# Patient Record
Sex: Female | Born: 1955
Health system: Southern US, Community
[De-identification: ages and names within clinical notes are randomized; demographics above are authoritative.]

## PROBLEM LIST (undated history)

## (undated) DIAGNOSIS — N393 Stress incontinence (female) (male): Secondary | ICD-10-CM

## (undated) DIAGNOSIS — F329 Major depressive disorder, single episode, unspecified: Secondary | ICD-10-CM

## (undated) DIAGNOSIS — M722 Plantar fascial fibromatosis: Secondary | ICD-10-CM

## (undated) DIAGNOSIS — F32A Depression, unspecified: Secondary | ICD-10-CM

---

## 2002-11-05 ENCOUNTER — Ambulatory Visit (HOSPITAL_COMMUNITY): Admission: RE | Admit: 2002-11-05 | Discharge: 2002-11-05 | Payer: Self-pay | Admitting: Gastroenterology

## 2003-03-03 ENCOUNTER — Other Ambulatory Visit: Admission: RE | Admit: 2003-03-03 | Discharge: 2003-03-03 | Payer: Self-pay | Admitting: Family Medicine

## 2004-10-25 ENCOUNTER — Ambulatory Visit (HOSPITAL_COMMUNITY): Admission: RE | Admit: 2004-10-25 | Discharge: 2004-10-25 | Payer: Self-pay | Admitting: Family Medicine

## 2004-10-25 ENCOUNTER — Ambulatory Visit: Admission: RE | Admit: 2004-10-25 | Discharge: 2004-10-25 | Payer: Self-pay | Admitting: Family Medicine

## 2004-11-09 ENCOUNTER — Other Ambulatory Visit: Admission: RE | Admit: 2004-11-09 | Discharge: 2004-11-09 | Payer: Self-pay | Admitting: Family Medicine

## 2005-02-02 HISTORY — PX: LAPAROSCOPIC GASTRIC BYPASS: SUR771

## 2006-01-25 ENCOUNTER — Other Ambulatory Visit: Admission: RE | Admit: 2006-01-25 | Discharge: 2006-01-25 | Payer: Self-pay | Admitting: Family Medicine

## 2006-03-28 HISTORY — PX: PANNICULECTOMY: SHX5360

## 2006-03-28 HISTORY — PX: PLACEMENT OF BREAST IMPLANTS: SHX6334

## 2007-03-07 ENCOUNTER — Other Ambulatory Visit: Admission: RE | Admit: 2007-03-07 | Discharge: 2007-03-07 | Payer: Self-pay | Admitting: Family Medicine

## 2008-03-14 ENCOUNTER — Other Ambulatory Visit: Admission: RE | Admit: 2008-03-14 | Discharge: 2008-03-14 | Payer: Self-pay | Admitting: Family Medicine

## 2008-05-29 ENCOUNTER — Other Ambulatory Visit: Admission: RE | Admit: 2008-05-29 | Discharge: 2008-05-29 | Payer: Self-pay | Admitting: Family Medicine

## 2009-03-31 ENCOUNTER — Other Ambulatory Visit: Admission: RE | Admit: 2009-03-31 | Discharge: 2009-03-31 | Payer: Self-pay | Admitting: Family Medicine

## 2010-10-05 ENCOUNTER — Other Ambulatory Visit: Payer: Self-pay | Admitting: Family Medicine

## 2010-10-05 ENCOUNTER — Other Ambulatory Visit (HOSPITAL_COMMUNITY)
Admission: RE | Admit: 2010-10-05 | Discharge: 2010-10-05 | Disposition: A | Discharge status: Home / Self Care | Payer: BC Managed Care – PPO | Source: Ambulatory Visit | Attending: Family Medicine | Admitting: Family Medicine

## 2010-10-05 DIAGNOSIS — Z0000 Encounter for general adult medical examination without abnormal findings: Secondary | ICD-10-CM | POA: Insufficient documentation

## 2010-11-19 ENCOUNTER — Other Ambulatory Visit: Payer: Self-pay | Admitting: Dermatology

## 2011-04-18 ENCOUNTER — Other Ambulatory Visit (HOSPITAL_COMMUNITY)
Admission: RE | Admit: 2011-04-18 | Discharge: 2011-04-18 | Disposition: A | Discharge status: Home / Self Care | Payer: BC Managed Care – PPO | Source: Ambulatory Visit | Attending: Family Medicine | Admitting: Family Medicine

## 2011-04-18 ENCOUNTER — Other Ambulatory Visit: Payer: Self-pay | Admitting: Family Medicine

## 2011-04-18 DIAGNOSIS — R87619 Unspecified abnormal cytological findings in specimens from cervix uteri: Secondary | ICD-10-CM | POA: Insufficient documentation

## 2011-10-07 ENCOUNTER — Other Ambulatory Visit: Payer: Self-pay | Admitting: Family Medicine

## 2011-10-07 ENCOUNTER — Other Ambulatory Visit (HOSPITAL_COMMUNITY)
Admission: RE | Admit: 2011-10-07 | Discharge: 2011-10-07 | Disposition: A | Discharge status: Home / Self Care | Payer: BC Managed Care – PPO | Source: Ambulatory Visit | Attending: Family Medicine | Admitting: Family Medicine

## 2011-10-07 DIAGNOSIS — R87619 Unspecified abnormal cytological findings in specimens from cervix uteri: Secondary | ICD-10-CM | POA: Insufficient documentation

## 2012-04-18 ENCOUNTER — Other Ambulatory Visit (HOSPITAL_COMMUNITY)
Admission: RE | Admit: 2012-04-18 | Discharge: 2012-04-18 | Disposition: A | Discharge status: Home / Self Care | Payer: BC Managed Care – PPO | Source: Ambulatory Visit | Attending: Family Medicine | Admitting: Family Medicine

## 2012-04-18 ENCOUNTER — Other Ambulatory Visit: Payer: Self-pay | Admitting: Family Medicine

## 2012-04-18 DIAGNOSIS — R87619 Unspecified abnormal cytological findings in specimens from cervix uteri: Secondary | ICD-10-CM | POA: Insufficient documentation

## 2013-08-15 ENCOUNTER — Other Ambulatory Visit (HOSPITAL_COMMUNITY)
Admission: RE | Admit: 2013-08-15 | Discharge: 2013-08-15 | Disposition: A | Discharge status: Home / Self Care | Payer: BC Managed Care – PPO | Source: Ambulatory Visit | Attending: Family Medicine | Admitting: Family Medicine

## 2013-08-15 ENCOUNTER — Other Ambulatory Visit: Payer: Self-pay | Admitting: Family Medicine

## 2013-08-15 DIAGNOSIS — Z0000 Encounter for general adult medical examination without abnormal findings: Secondary | ICD-10-CM | POA: Insufficient documentation

## 2013-08-15 DIAGNOSIS — Z1151 Encounter for screening for human papillomavirus (HPV): Secondary | ICD-10-CM | POA: Insufficient documentation

## 2013-12-18 ENCOUNTER — Other Ambulatory Visit: Payer: Self-pay | Admitting: Dermatology

## 2014-03-18 ENCOUNTER — Other Ambulatory Visit: Payer: Self-pay | Admitting: Dermatology

## 2015-06-10 ENCOUNTER — Other Ambulatory Visit: Payer: Self-pay | Admitting: Family Medicine

## 2015-06-10 ENCOUNTER — Other Ambulatory Visit (HOSPITAL_COMMUNITY)
Admission: RE | Admit: 2015-06-10 | Discharge: 2015-06-10 | Disposition: A | Discharge status: Home / Self Care | Payer: BLUE CROSS/BLUE SHIELD | Source: Ambulatory Visit | Attending: Family Medicine | Admitting: Family Medicine

## 2015-06-10 DIAGNOSIS — Z124 Encounter for screening for malignant neoplasm of cervix: Secondary | ICD-10-CM | POA: Insufficient documentation

## 2015-06-12 LAB — CYTOLOGY - PAP

## 2015-06-26 MED ORDER — ASPIRIN 81 MG PO CHEW
CHEWABLE_TABLET | ORAL | Status: AC
  Filled 2015-06-26: qty 1

## 2015-11-27 ENCOUNTER — Emergency Department (HOSPITAL_COMMUNITY): Payer: BLUE CROSS/BLUE SHIELD

## 2015-11-27 ENCOUNTER — Inpatient Hospital Stay (HOSPITAL_COMMUNITY): Payer: BLUE CROSS/BLUE SHIELD

## 2015-11-27 ENCOUNTER — Encounter (HOSPITAL_COMMUNITY): Payer: Self-pay | Admitting: Emergency Medicine

## 2015-11-27 ENCOUNTER — Observation Stay (HOSPITAL_COMMUNITY)
Admission: EM | Admit: 2015-11-27 | Discharge: 2015-11-28 | Disposition: A | Discharge status: Home / Self Care | Payer: BLUE CROSS/BLUE SHIELD | Source: Ambulatory Visit | Attending: Surgery | Admitting: Surgery

## 2015-11-27 DIAGNOSIS — Y92002 Bathroom of unspecified non-institutional (private) residence single-family (private) house as the place of occurrence of the external cause: Secondary | ICD-10-CM | POA: Diagnosis not present

## 2015-11-27 DIAGNOSIS — S270XXA Traumatic pneumothorax, initial encounter: Secondary | ICD-10-CM | POA: Diagnosis not present

## 2015-11-27 DIAGNOSIS — S2241XA Multiple fractures of ribs, right side, initial encounter for closed fracture: Secondary | ICD-10-CM | POA: Diagnosis not present

## 2015-11-27 DIAGNOSIS — Z9884 Bariatric surgery status: Secondary | ICD-10-CM | POA: Diagnosis not present

## 2015-11-27 DIAGNOSIS — W19XXXA Unspecified fall, initial encounter: Secondary | ICD-10-CM | POA: Diagnosis not present

## 2015-11-27 DIAGNOSIS — J939 Pneumothorax, unspecified: Secondary | ICD-10-CM

## 2015-11-27 DIAGNOSIS — Y92009 Unspecified place in unspecified non-institutional (private) residence as the place of occurrence of the external cause: Secondary | ICD-10-CM

## 2015-11-27 DIAGNOSIS — N393 Stress incontinence (female) (male): Secondary | ICD-10-CM

## 2015-11-27 HISTORY — DX: Depression, unspecified: F32.A

## 2015-11-27 HISTORY — DX: Plantar fascial fibromatosis: M72.2

## 2015-11-27 HISTORY — DX: Stress incontinence (female) (male): N39.3

## 2015-11-27 HISTORY — DX: Major depressive disorder, single episode, unspecified: F32.9

## 2015-11-27 LAB — CBC WITH DIFFERENTIAL/PLATELET
BASOS ABS: 0 10*3/uL (ref 0.0–0.1)
Basophils Relative: 0 %
Eosinophils Absolute: 0 10*3/uL (ref 0.0–0.7)
Eosinophils Relative: 0 %
HEMATOCRIT: 38.8 % (ref 36.0–46.0)
HEMOGLOBIN: 12.7 g/dL (ref 12.0–15.0)
LYMPHS ABS: 1.1 10*3/uL (ref 0.7–4.0)
LYMPHS PCT: 13 %
MCH: 29.6 pg (ref 26.0–34.0)
MCHC: 32.7 g/dL (ref 30.0–36.0)
MCV: 90.4 fL (ref 78.0–100.0)
Monocytes Absolute: 0.3 10*3/uL (ref 0.1–1.0)
Monocytes Relative: 3 %
NEUTROS ABS: 7.2 10*3/uL (ref 1.7–7.7)
Neutrophils Relative %: 84 %
Platelets: 164 10*3/uL (ref 150–400)
RBC: 4.29 MIL/uL (ref 3.87–5.11)
RDW: 14.2 % (ref 11.5–15.5)
WBC: 8.6 10*3/uL (ref 4.0–10.5)

## 2015-11-27 LAB — BASIC METABOLIC PANEL
ANION GAP: 7 (ref 5–15)
BUN: 23 mg/dL — AB (ref 6–20)
CHLORIDE: 107 mmol/L (ref 101–111)
CO2: 24 mmol/L (ref 22–32)
Calcium: 9 mg/dL (ref 8.9–10.3)
Creatinine, Ser: 0.88 mg/dL (ref 0.44–1.00)
GFR calc Af Amer: >60 mL/min (ref >60)
GLUCOSE: 112 mg/dL — AB (ref 65–99)
POTASSIUM: 4 mmol/L (ref 3.5–5.1)
Sodium: 138 mmol/L (ref 135–145)

## 2015-11-27 LAB — MRSA PCR SCREENING: MRSA BY PCR: NEGATIVE

## 2015-11-27 MED ORDER — DIPHENHYDRAMINE HCL 50 MG/ML IJ SOLN: 12.5000 mg | Freq: Four times a day (QID) | INTRAMUSCULAR | Status: DC | PRN

## 2015-11-27 MED ORDER — ACETAMINOPHEN 500 MG PO TABS
1000.0000 mg | ORAL_TABLET | Freq: Three times a day (TID) | ORAL | Status: DC
  Administered 2015-11-27 – 2015-11-28 (×4): 1000 mg via ORAL
  Filled 2015-11-27 (×3): qty 2

## 2015-11-27 MED ORDER — HYDROMORPHONE HCL 1 MG/ML IJ SOLN: 0.5000 mg | INTRAMUSCULAR | Status: DC | PRN

## 2015-11-27 MED ORDER — GUAIFENESIN-DM 100-10 MG/5ML PO SYRP: 10.0000 mL | ORAL_SOLUTION | ORAL | Status: DC | PRN

## 2015-11-27 MED ORDER — ENOXAPARIN SODIUM 40 MG/0.4ML ~~LOC~~ SOLN
40.0000 mg | SUBCUTANEOUS | Status: DC
  Administered 2015-11-27 – 2015-11-28 (×2): 40 mg via SUBCUTANEOUS
  Filled 2015-11-27 (×2): qty 0.4

## 2015-11-27 MED ORDER — FENTANYL CITRATE (PF) 100 MCG/2ML IJ SOLN
100.0000 ug | Freq: Once | INTRAMUSCULAR | Status: AC
  Administered 2015-11-27: 100 ug via INTRAVENOUS
  Filled 2015-11-27: qty 2

## 2015-11-27 MED ORDER — MAGIC MOUTHWASH: 15.0000 mL | Freq: Four times a day (QID) | ORAL | Status: DC | PRN

## 2015-11-27 MED ORDER — VITAMIN C 500 MG PO TABS
500.0000 mg | ORAL_TABLET | Freq: Two times a day (BID) | ORAL | Status: DC
Start: 2015-11-27 — End: 2015-11-28
  Administered 2015-11-27 – 2015-11-28 (×3): 500 mg via ORAL
  Filled 2015-11-27 (×3): qty 1

## 2015-11-27 MED ORDER — LIP MEDEX EX OINT
1.0000 "application " | TOPICAL_OINTMENT | Freq: Two times a day (BID) | CUTANEOUS | Status: DC
  Administered 2015-11-27 (×2): 1 via TOPICAL
  Filled 2015-11-27: qty 7

## 2015-11-27 MED ORDER — ONDANSETRON HCL 4 MG/2ML IJ SOLN: 4.0000 mg | Freq: Four times a day (QID) | INTRAMUSCULAR | Status: DC | PRN

## 2015-11-27 MED ORDER — METOPROLOL TARTRATE 12.5 MG HALF TABLET: 12.5000 mg | ORAL_TABLET | Freq: Two times a day (BID) | ORAL | Status: DC | PRN

## 2015-11-27 MED ORDER — HYDRALAZINE HCL 20 MG/ML IJ SOLN: 5.0000 mg | Freq: Four times a day (QID) | INTRAMUSCULAR | Status: DC | PRN

## 2015-11-27 MED ORDER — BISACODYL 10 MG RE SUPP: 10.0000 mg | Freq: Two times a day (BID) | RECTAL | Status: DC | PRN

## 2015-11-27 MED ORDER — LACTATED RINGERS IV SOLN
INTRAVENOUS | Status: DC
  Administered 2015-11-27 – 2015-11-28 (×3): via INTRAVENOUS

## 2015-11-27 MED ORDER — METHOCARBAMOL 1000 MG/10ML IJ SOLN
1000.0000 mg | Freq: Four times a day (QID) | INTRAVENOUS | Status: DC | PRN
  Administered 2015-11-27: 1000 mg via INTRAVENOUS
  Filled 2015-11-27 (×3): qty 10

## 2015-11-27 MED ORDER — LACTATED RINGERS IV BOLUS (SEPSIS)
1000.0000 mL | Freq: Once | INTRAVENOUS | Status: AC
  Administered 2015-11-27: 1000 mL via INTRAVENOUS

## 2015-11-27 MED ORDER — MENTHOL 3 MG MT LOZG
1.0000 | LOZENGE | OROMUCOSAL | Status: DC | PRN
  Filled 2015-11-27: qty 9

## 2015-11-27 MED ORDER — OXYCODONE HCL 5 MG PO TABS
5.0000 mg | ORAL_TABLET | ORAL | Status: DC | PRN
  Administered 2015-11-27 – 2015-11-28 (×4): 10 mg via ORAL
  Filled 2015-11-27 (×4): qty 2

## 2015-11-27 MED ORDER — SODIUM CHLORIDE 0.9 % IV SOLN
8.0000 mg | Freq: Four times a day (QID) | INTRAVENOUS | Status: DC | PRN
  Filled 2015-11-27: qty 4

## 2015-11-27 MED ORDER — ALUM & MAG HYDROXIDE-SIMETH 200-200-20 MG/5ML PO SUSP: 30.0000 mL | Freq: Four times a day (QID) | ORAL | Status: DC | PRN

## 2015-11-27 MED ORDER — ONDANSETRON HCL 4 MG/2ML IJ SOLN: 4.0000 mg | Freq: Once | INTRAMUSCULAR | Status: DC

## 2015-11-27 MED ORDER — PHENOL 1.4 % MT LIQD
2.0000 | OROMUCOSAL | Status: DC | PRN
  Filled 2015-11-27: qty 177

## 2015-11-27 MED ORDER — ADULT MULTIVITAMIN W/MINERALS CH
1.0000 | ORAL_TABLET | Freq: Every day | ORAL | Status: DC
  Administered 2015-11-27 – 2015-11-28 (×2): 1 via ORAL
  Filled 2015-11-27 (×2): qty 1

## 2015-11-27 MED ORDER — HYDROCORTISONE 2.5 % RE CREA
1.0000 "application " | TOPICAL_CREAM | Freq: Four times a day (QID) | RECTAL | Status: DC | PRN
  Filled 2015-11-27: qty 28.35

## 2015-11-27 MED ORDER — ONDANSETRON HCL 4 MG/2ML IJ SOLN
4.0000 mg | Freq: Once | INTRAMUSCULAR | Status: AC | PRN
  Administered 2015-11-27: 4 mg via INTRAVENOUS
  Filled 2015-11-27: qty 2

## 2015-11-27 MED ORDER — HYDROMORPHONE HCL 1 MG/ML IJ SOLN: 1.0000 mg | Freq: Once | INTRAMUSCULAR | Status: DC | PRN

## 2015-11-27 MED ORDER — PROCHLORPERAZINE EDISYLATE 5 MG/ML IJ SOLN: 5.0000 mg | INTRAMUSCULAR | Status: DC | PRN

## 2015-11-27 MED ORDER — POLYETHYLENE GLYCOL 3350 17 G PO PACK
17.0000 g | PACK | Freq: Every day | ORAL | Status: DC
  Administered 2015-11-28: 17 g via ORAL
  Filled 2015-11-27: qty 1

## 2015-11-27 MED ORDER — LACTATED RINGERS IV BOLUS (SEPSIS): 1000.0000 mL | Freq: Three times a day (TID) | INTRAVENOUS | Status: DC | PRN

## 2015-11-27 MED ORDER — METOPROLOL TARTRATE 5 MG/5ML IV SOLN
5.0000 mg | Freq: Four times a day (QID) | INTRAVENOUS | Status: DC | PRN
Start: 2015-11-27 — End: 2015-11-28

## 2015-11-27 NOTE — ED Provider Notes (Signed)
[facility] [facility] DEPT Provider Note   CSN: [id number] Arrival date & time: 11/27/15  0405     History   Chief Complaint Chief Complaint  Patient presents with  . Rib Injury    HPI Debra [name] is a 60 y.o. female who got up to go to the bathroom this morning and fell on her right side. She is having moderate to severe pain in her right lower ribs. Pain is worse with movement, palpation or deep breathing. She is also having some milder pain in her right shoulder. She denies hematuria.  HPI  History reviewed. No pertinent past medical history.  There are no active problems to display for this patient.   History reviewed. No pertinent surgical history.  OB History    No data available       Home Medications    Prior to Admission medications   Medication Sig Start Date End Date Taking? Authorizing Provider  Ibuprofen-Diphenhydramine HCl (ADVIL PM) 200-25 MG CAPS Take 1 capsule by mouth at bedtime as needed (sleep).   Yes Historical Provider, MD    Family History History reviewed. No pertinent family history.  Social History Social History  Substance Use Topics  . Smoking status: Never Smoker  . Smokeless tobacco: Never Used  . Alcohol use No     Allergies   Review of patient's allergies indicates no known allergies.   Review of Systems Review of Systems  All other systems reviewed and are negative.   Physical Exam Updated Vital Signs BP 104/61   Pulse 70   Temp 98.4 F (36.9 C) (Oral)   Resp 16   Ht 5\' 7"  (1.702 m)   Wt 200 lb (90.7 kg)   SpO2 100%   BMI 31.32 kg/m   Physical Exam General: Well-developed, well-nourished female in no acute distress; appearance consistent with age of record HENT: normocephalic; atraumatic Eyes: pupils equal, round and reactive to light; extraocular muscles intact Neck: supple Heart: regular rate and rhythm Lungs: clear to auscultation bilaterally except a few rales in the right base Chest: Right lower rib  tenderness without deformity or crepitus palpated Abdomen: soft; nondistended; nontender to palpation of the right upper quadrant exacerbates rib pain; no masses or hepatosplenomegaly; bowel sounds present Extremities: No deformity; full range of motion; pulses normal; mild pain on range of motion of right shoulder without bony point tenderness Neurologic: Awake, alert and oriented; motor function intact in all extremities and symmetric; no facial droop Skin: Warm and dry Psychiatric: Normal mood and affect    ED Treatments / Results  Nursing notes and vitals signs, including pulse oximetry, reviewed.  Summary of this visit's results, reviewed by myself:  Imaging Studies: Dg Ribs Unilateral W/chest Right  Result Date: 11/27/2015 CLINICAL DATA:  [place] at home this morning. Now difficulty breathing. EXAM: RIGHT RIBS AND CHEST - 3+ VIEW COMPARISON:  None. FINDINGS: There is a small right pneumothorax, approximately 10% by volume. There are multiple right rib fractures, probably involving the 8th through 12th ribs, mildly displaced. There is subcutaneous emphysema in the right hemi thorax. Mediastinal contours are normal. The left lung is clear. IMPRESSION: Small right pneumothorax. Multiple right rib fractures, mildly displaced. These results were called by telephone at the time of interpretation on 11/27/2015 at 6:29 am to Dr. Shanon Rosser , who verbally acknowledged these results. Electronically Signed   By: Andreas Newport M.D.   On: 11/27/2015 06:30   7:06 AM Trauma surgery consulted, will admit the patient to the [facility]  trauma service.  Procedures (including critical care time)   Final Clinical Impressions(s) / ED Diagnoses   Final diagnoses:  Fall at home, initial encounter  Multiple rib fractures, right, closed, initial encounter  Traumatic pneumothorax, initial encounter      Shanon Rosser, MD 11/27/15 [phone]

## 2015-11-27 NOTE — ED Triage Notes (Addendum)
Patient rolled out of bed onto floor on right side around midnight. Patient is complaining of pain on her right side. Patient took advil about 20 minutes ago.

## 2015-11-27 NOTE — ED Triage Notes (Signed)
Trauma PA in with pt, pt aware she will go to [facility] for treatment.

## 2015-11-27 NOTE — ED Triage Notes (Signed)
Larkin Ina RN with [facility] given report.

## 2015-11-27 NOTE — H&P (Signed)
Debra [name] is an 60 y.o. female.   PCP:  No primary care provider on file.  Chief Complaint: Right rib pain HPI:  Pt reports getting up to go to BR this AM and fell on the right side. She does not remember tripping.  She lives alone with her cat, she is not aware of a LOC. She remembers getting up and trying to go back to bed, but bed is high so she went back to her den. She then got up to go to BR, and pain was so bad she called EMS. She has never had this happen before Pain worse with movement, and deep breathing some in her right shoulder.  Work up in the ED shows she is afebrile, VSS, CBC is normal.  CXR shows a small right pneumothorax with fracture 8-12th ribs, and 10% right pneumothorax.  We are ask to see.  Past Medical History:  Diagnosis Date  . History of breast implant 2008  . History of gastric bypass 01/2006  . S/P panniculectomy 2008    History reviewed. No pertinent surgical history.  History reviewed. No pertinent family history. Social History:  reports that she has never smoked. She has never used smokeless tobacco. She reports that she does not drink alcohol or use drugs.  Allergies: No Known Allergies  Prior to Admission medications   Medication Sig Start Date End Date Taking? Authorizing Provider  Ibuprofen-Diphenhydramine HCl (ADVIL PM) 200-25 MG CAPS Take 1 capsule by mouth at bedtime as needed (sleep).   Yes Historical Provider, MD     Results for orders placed or performed during the hospital encounter of 11/27/15 (from the past 48 hour(s))  CBC with Differential/Platelet     Status: None   Collection Time: 11/27/15  7:04 AM  Result Value Ref Range   WBC 8.6 4.0 - 10.5 K/uL   RBC 4.29 3.87 - 5.11 MIL/uL   Hemoglobin 12.7 12.0 - 15.0 g/dL   HCT 38.8 36.0 - 46.0 %   MCV 90.4 78.0 - 100.0 fL   MCH 29.6 26.0 - 34.0 pg   MCHC 32.7 30.0 - 36.0 g/dL   RDW 14.2 11.5 - 15.5 %   Platelets 164 150 - 400 K/uL   Neutrophils Relative % 84 %   Neutro Abs 7.2 1.7  - 7.7 K/uL   Lymphocytes Relative 13 %   Lymphs Abs 1.1 0.7 - 4.0 K/uL   Monocytes Relative 3 %   Monocytes Absolute 0.3 0.1 - 1.0 K/uL   Eosinophils Relative 0 %   Eosinophils Absolute 0.0 0.0 - 0.7 K/uL   Basophils Relative 0 %   Basophils Absolute 0.0 0.0 - 0.1 K/uL  Basic metabolic panel     Status: Abnormal   Collection Time: 11/27/15  7:04 AM  Result Value Ref Range   Sodium 138 135 - 145 mmol/L   Potassium 4.0 3.5 - 5.1 mmol/L   Chloride 107 101 - 111 mmol/L   CO2 24 22 - 32 mmol/L   Glucose, Bld 112 (H) 65 - 99 mg/dL   BUN 23 (H) 6 - 20 mg/dL   Creatinine, Ser 0.88 0.44 - 1.00 mg/dL   Calcium 9.0 8.9 - 10.3 mg/dL   GFR calc non Af Amer >60 >60 mL/min   GFR calc Af Amer >60 >60 mL/min    Comment: (NOTE) The eGFR has been calculated using the CKD EPI equation. This calculation has not been validated in all clinical situations. eGFR's persistently <60 mL/min signify possible Chronic  Kidney Disease.    Anion gap 7 5 - 15   Dg Ribs Unilateral W/chest Right  Result Date: 11/27/2015 CLINICAL DATA:  [place] at home this morning. Now difficulty breathing. EXAM: RIGHT RIBS AND CHEST - 3+ VIEW COMPARISON:  None. FINDINGS: There is a small right pneumothorax, approximately 10% by volume. There are multiple right rib fractures, probably involving the 8th through 12th ribs, mildly displaced. There is subcutaneous emphysema in the right hemi thorax. Mediastinal contours are normal. The left lung is clear. IMPRESSION: Small right pneumothorax. Multiple right rib fractures, mildly displaced. These results were called by telephone at the time of interpretation on 11/27/2015 at 6:29 am to Dr. Shanon Rosser , who verbally acknowledged these results. Electronically Signed   By: Andreas Newport M.D.   On: 11/27/2015 06:30    Review of Systems  Constitutional: Negative.   HENT: Negative.   Eyes: Negative.   Respiratory: Negative.        Pain right side lower back to shoulder and some now radiating  to her right upper quadrant.  Cardiovascular: Negative.   Gastrointestinal: Positive for diarrhea (occasional ). Negative for abdominal pain, blood in stool, constipation, heartburn, melena, nausea and vomiting.  Genitourinary: Negative.   Musculoskeletal: Negative.   Skin: Negative.   Neurological: Negative.   Endo/Heme/Allergies: Negative.   Psychiatric/Behavioral: Negative.     Blood pressure 104/61, pulse 70, temperature 98.4 F (36.9 C), temperature source Oral, resp. rate 16, height '5\' 7"'  (1.702 m), weight 90.7 kg (200 lb), SpO2 100 %. Physical Exam  Constitutional: She is oriented to person, place, and time. She appears well-developed and well-nourished. No distress.  HENT:  Head: Normocephalic and atraumatic.  Mouth/Throat: No oropharyngeal exudate.  Eyes: EOM are normal. Right eye exhibits no discharge. Left eye exhibits no discharge. No scleral icterus.  Neck: Normal range of motion. Neck supple. No JVD present. No tracheal deviation present. No thyromegaly present.  Cardiovascular: Normal rate, regular rhythm, normal heart sounds and intact distal pulses.   No murmur heard. Respiratory: Breath sounds normal. No respiratory distress. She has no wheezes. She has no rales. She exhibits tenderness (pain right lower posterior chest, with some radiaiton to the right shoulder and some now to RUQ.  ).  Scars chest wall and axilla from panniculectomy and breast implants.  She denies SOB with good saturations on room air.  GI: Soft. Bowel sounds are normal. She exhibits no distension and no mass. There is no tenderness. There is no rebound and no guarding.  Scars on abd from gastric bypass surgery  Musculoskeletal: She exhibits no edema or tenderness.  Lymphadenopathy:    She has no cervical adenopathy.  Neurological: She is alert and oriented to person, place, and time. No cranial nerve deficit.  Skin: Skin is warm and dry. No rash noted. She is not diaphoretic. No erythema. No  pallor.  Psychiatric: She has a normal mood and affect. Her behavior is normal. Judgment and thought content normal.     Assessment/Plan Fall unwitnessed, no know LOC Fracture right 8-12th ribs with 10% right pneumothorax  Plan:  Transfer to [facility] Trauma service, recheck film later today, telemetry, and pain control.   Izak Anding, PA-C 11/27/2015, 8:05 AM

## 2015-11-27 NOTE — Care Management Note (Addendum)
Case Management Note  Patient Details  Name: Debra [name] MRN: [id number] Date of Birth: 1955-07-07  Subjective/Objective:   Patient s/p fall at home, presents with rib fx's and ptx,  NCM will cont to follow for dc needs.   Patient states she has pcp Harlan Stains.               Action/Plan:   Expected Discharge Date:                  Expected Discharge Plan:  Home/Self Care  In-House Referral:     Discharge planning Services  CM Consult  Post Acute Care Choice:    Choice offered to:     DME Arranged:    DME Agency:     HH Arranged:    HH Agency:     Status of Service:  In process, will continue to follow  If discussed at Long Length of Stay Meetings, dates discussed:    Additional Comments:  Zenon Mayo, RN 11/27/2015, 5:13 PM

## 2015-11-27 NOTE — Progress Notes (Signed)
Initial Nutrition Assessment  DOCUMENTATION CODES:   Obesity unspecified  INTERVENTION:    Recommend Regular Diet (pt is far enough out from her gastric surgery for regular diet)  Recommend 500 mg of Calcium Citrate TID  Recommend checking Vitamin D level; if deficient provide 50000 IU of Vitamin D3 daily for 8 weeks)  Continue Multivitamin with minerals daily (pt taking PTA)  Continue 350 mcg of Vitamin D 12 daily (Pt taking PTA)  Encouraged healthful diet with protein, dairy, fruits, and vegetables and discussed recommended vitamin/mineral supplements as above   NUTRITION DIAGNOSIS:   Increased nutrient needs related to acute illness (injury (rib fractures) and history of gastric bypass ) as evidenced by estimated needs.   GOAL:   Patient will meet greater than or equal to 90% of their needs   MONITOR:   PO intake, Labs, Diet advancement  REASON FOR ASSESSMENT:   Consult Diet education (hx of gastric bypass surgery in 2006)  ASSESSMENT:   60 year old female with history of gastric lap band band surgery in 2006. S/P fall. CXR shows a small right pneumothorax with fracture 8-12th ribs, and 10% right pneumothorax.   Pt reports that she was eating well PTA with 3 meals daily and protein at most meals. She states that she doesn't eat as healthy as she used to, but reports taking a Multivitamin, Vitamin D (2000 IU most days), and Vitamin B12 daily. She states that she has been maintaining 197 lbs. States that she has been fairly active recently, painting several rooms in her house and gardening outside twice per week.   Labs reviewed.   Diet Order:  Diet bariatric advanced Room service appropriate? Yes; Fluid consistency: Thin  Skin:  Reviewed, no issues  Last BM:  unknown  Height:   Ht Readings from Last 1 Encounters:  11/27/15 5\' 7"  (1.702 m)    Weight:   Wt Readings from Last 1 Encounters:  11/27/15 197 lb 1.5 oz (89.4 kg)    Ideal Body Weight:  61.36  kg  BMI:  Body mass index is 30.87 kg/m.  Estimated Nutritional Needs:   Kcal:  2000-2200  Protein:  >/=90 grams/day  Fluid:  2.2-2.5 L/day  EDUCATION NEEDS:   No education needs identified at this time  Debra [name] RD, LDN, CSP Inpatient Clinical Dietitian Pager: [phone] After Hours Pager: [phone]

## 2015-11-28 ENCOUNTER — Inpatient Hospital Stay (HOSPITAL_COMMUNITY): Payer: BLUE CROSS/BLUE SHIELD

## 2015-11-28 LAB — CBC
HCT: 37.1 % (ref 36.0–46.0)
HEMOGLOBIN: 11.4 g/dL — AB (ref 12.0–15.0)
MCH: 28.8 pg (ref 26.0–34.0)
MCHC: 30.7 g/dL (ref 30.0–36.0)
MCV: 93.7 fL (ref 78.0–100.0)
Platelets: 110 10*3/uL — ABNORMAL LOW (ref 150–400)
RBC: 3.96 MIL/uL (ref 3.87–5.11)
RDW: 14.3 % (ref 11.5–15.5)
WBC: 5.2 10*3/uL (ref 4.0–10.5)

## 2015-11-28 LAB — BASIC METABOLIC PANEL
ANION GAP: 6 (ref 5–15)
BUN: 12 mg/dL (ref 6–20)
CALCIUM: 8.3 mg/dL — AB (ref 8.9–10.3)
CHLORIDE: 107 mmol/L (ref 101–111)
CO2: 27 mmol/L (ref 22–32)
CREATININE: 0.78 mg/dL (ref 0.44–1.00)
GFR calc Af Amer: >60 mL/min (ref >60)
GFR calc non Af Amer: >60 mL/min (ref >60)
GLUCOSE: 95 mg/dL (ref 65–99)
Potassium: 3.9 mmol/L (ref 3.5–5.1)
Sodium: 140 mmol/L (ref 135–145)

## 2015-11-28 MED ORDER — HYDROCORTISONE 2.5 % RE CREA: 1.0000 "application " | TOPICAL_CREAM | Freq: Four times a day (QID) | RECTAL | 0 refills | Status: DC | PRN

## 2015-11-28 MED ORDER — OXYCODONE HCL 5 MG PO TABS: 5.0000 mg | ORAL_TABLET | ORAL | 0 refills | Status: DC | PRN

## 2015-11-28 MED ORDER — METHOCARBAMOL 500 MG PO TABS: 500.0000 mg | ORAL_TABLET | Freq: Four times a day (QID) | ORAL | 0 refills | Status: DC | PRN

## 2015-11-28 MED ORDER — ACETAMINOPHEN 500 MG PO TABS: 1000.0000 mg | ORAL_TABLET | Freq: Three times a day (TID) | ORAL | 0 refills | Status: DC

## 2015-11-28 MED ORDER — IBUPROFEN 600 MG PO TABS: 600.0000 mg | ORAL_TABLET | Freq: Four times a day (QID) | ORAL | 0 refills | Status: DC | PRN

## 2015-11-28 NOTE — Discharge Summary (Signed)
Physician Discharge Summary  Patient ID: Debra [name] MRN: [id number] DOB/AGE: 1955/12/30 60 y.o.  Admit date: 11/27/2015 Discharge date: 11/28/2015  Admission Diagnoses: Patient Active Problem List   Diagnosis Date Noted  . Fall at home 11/27/2015  . Pneumothorax on right 11/27/2015  . Multiple fractures of ribs 8-12, right side,  11/27/2015  . Pneumothorax 11/27/2015  . S/P gastric bypass 2007 11/27/2015  . SUI (stress urinary incontinence, female)     Discharge Diagnoses:  Principal Problem:   Pneumothorax on right Active Problems:   Fall at home   Multiple fractures of ribs 8-12, right side,    Pneumothorax   S/P gastric bypass 2007   Discharged Condition: stable  Hospital Course:  Pt [facility] long ED.  She had a small pneumothorax without dyspnea, but significant rib pain from rib fractures.  She was observed in the stepdown unit without event.  She had tolerable pain.  She was instructed in incentive spirometry.  Her CXR was stable for around 24 hours and improved from initial.  She continues to deny dyspnea.  She will be discharged to home in stable condition.    Consults: None  Significant Diagnostic Studies: radiology: see above  Treatments: pain control, observation, repeat CXR  Discharge Exam: Blood pressure (!) 110/49, pulse 72, temperature 98 F (36.7 C), temperature source Oral, resp. rate 14, height 5\' 7"  (1.702 m), weight 89.4 kg (197 lb 1.5 oz), SpO2 100 %. General appearance: alert, cooperative and no distress Resp: breathing comfortably Cardio: regular rate and rhythm  Disposition: home  Discharge Instructions    Call MD for:  difficulty breathing, headache or visual disturbances    Complete by:  As directed   Call MD for:  persistant nausea and vomiting    Complete by:  As directed   Call MD for:  redness, tenderness, or signs of infection (pain, swelling, redness, odor or green/yellow discharge  around incision site)    Complete by:  As directed   Call MD for:  severe uncontrolled pain    Complete by:  As directed   Call MD for:  temperature >100.4    Complete by:  As directed   Diet - low sodium heart healthy    Complete by:  As directed   Incentive spirometry RT    Complete by:  As directed   Every 2 hours for 10 times while awake.   Increase activity slowly    Complete by:  As directed       Medication List    TAKE these medications   acetaminophen 500 MG tablet Commonly known as:  TYLENOL Take 2 tablets (1,000 mg total) by mouth 3 (three) times daily.   ADVIL PM 200-25 MG Caps Generic drug:  Ibuprofen-Diphenhydramine HCl Take 1 capsule by mouth at bedtime as needed (sleep).   hydrocortisone 2.5 % rectal cream Commonly known as:  ANUSOL-HC Apply 1 application topically 4 (four) times daily as needed for hemorrhoids.   ibuprofen 600 MG tablet Commonly known as:  ADVIL,MOTRIN Take 1 tablet (600 mg total) by mouth every 6 (six) hours as needed.   methocarbamol 500 MG tablet Commonly known as:  ROBAXIN Take 1 tablet (500 mg total) by mouth every 6 (six) hours as needed for muscle spasms.   oxyCODONE 5 MG immediate release tablet Commonly known as:  Oxy IR/ROXICODONE Take 1-2 tablets (5-10 mg total) by mouth every 4 (four) hours as needed for moderate pain,  severe pain or breakthrough pain.      Follow-up Information    [facility] Follow up in 3 week(s).   Contact information: [place] [phone]          Signed: Stark Klein 11/28/2015, 1:22 PM

## 2015-11-28 NOTE — Progress Notes (Signed)
Discussed and explained Discharge instructions, prescriptions, follow up appts. Given to pt. Pt going home via w/c with belongings.

## 2015-11-28 NOTE — Discharge Instructions (Addendum)
Pneumothorax °A pneumothorax, commonly called a collapsed lung, is a condition in which air leaks from a lung and builds up in the space between the lung and the chest wall (pleural space). The air in a pneumothorax is trapped outside the lung and takes up space, preventing the lung from fully expanding. This is a condition that usually occurs suddenly. The buildup of air may be small or large. A small pneumothorax may go away on its own. When a pneumothorax is larger, it will often require medical treatment and hospitalization.  °CAUSES  °A pneumothorax can sometimes happen quickly with no apparent cause. People with underlying lung problems, particularly COPD or emphysema, are at higher risk of pneumothorax. However, pneumothorax can happen quickly even in people with no prior known lung problems. Trauma, surgery, medical procedures, or injury to the chest wall can also cause a pneumothorax. °SIGNS AND SYMPTOMS  °Sometimes a pneumothorax will have no symptoms. When symptoms are present, they can include: °· Chest pain. °· Shortness of breath. °· Increased rate of breathing. °· Bluish color to your lips or skin (cyanosis). °DIAGNOSIS  °Pneumothorax is usually diagnosed by a chest X-ray or chest CT scan. Your health care provider will also take a medical history and perform a physical exam to determine why you may have a pneumothorax. °TREATMENT  °A small pneumothorax may go away on its own without treatment. Extra oxygen can sometimes help a small pneumothorax go away more quickly. For a larger pneumothorax or a pneumothorax that is causing symptoms, a procedure is usually needed to drain the air. In some cases, the health care provider may drain the air using a needle. In other cases, a chest tube may be inserted into the pleural space. A chest tube is a small tube placed between the ribs and into the pleural space. This removes the extra air and allows the lung to expand back to its normal size. A large  pneumothorax will usually require a hospital stay. If there is ongoing air leakage into the pleural space, then the chest tube may need to remain in place for several days until the air leak has healed. In some cases, surgery may be needed.  °HOME CARE INSTRUCTIONS  °· Only take over-the-counter or prescription medicines as directed by your health care provider. °· If a cough or pain makes it difficult for you to sleep at night, try sleeping in a semi-upright position in a recliner or by using 2 or 3 pillows. °· Rest and limit activity as directed by your health care provider. °· If you had a chest tube and it was removed, ask your health care provider when it is okay to remove the dressing. Until your health care provider says you can remove the dressing, do not allow it to get wet. °· Do not smoke. Smoking is a risk factor for pneumothorax. °· Do not fly in an airplane or scuba dive until your health care provider says it is okay. °· Follow up with your health care provider as directed. °SEEK IMMEDIATE MEDICAL CARE IF:  °· You have increasing chest pain or shortness of breath. °· You have a cough that is not controlled with suppressants. °· You begin coughing up blood. °· You have pain that is getting worse or is not controlled with medicines. °· You cough up thick, discolored mucus (sputum) that is yellow to green in color. °· You have redness, increasing pain, or discharge at the site where a chest tube had been in place (if   your pneumothorax was treated with a chest tube).  The site where your chest tube was located opens up.  You feel air coming out of the site where the chest tube was placed.  You have a fever or persistent symptoms for more than 2-3 days.  You have a fever and your symptoms suddenly get worse. MAKE SURE YOU:   Understand these instructions.  Will watch your condition.  Will get help right away if you are not doing well or get worse.   This information is not intended to  replace advice given to you by your health care provider. Make sure you discuss any questions you have with your health care provider.   Document Released: 03/14/2005 Document Revised: 01/02/2013 Document Reviewed: 10/11/2012 Elsevier Interactive Patient Education 2016 [place].      Blunt Chest Trauma Blunt chest trauma is an injury caused by a blow to the chest. These chest injuries can be very painful. Blunt chest trauma often results in bruised or broken (fractured) ribs. Most cases of bruised and fractured ribs from blunt chest traumas get better after 1 to 3 weeks of rest and pain medicine. Often, the soft tissue in the chest wall is also injured, causing pain and bruising. Internal organs, such as the heart and lungs, may also be injured. Blunt chest trauma can lead to serious medical problems. This injury requires immediate medical care. CAUSES   Motor vehicle collisions.  Falls.  Physical violence.  Sports injuries. SYMPTOMS   Chest pain. The pain may be worse when you move or breathe deeply.  Shortness of breath.  Lightheadedness.  Bruising.  Tenderness.  Swelling. DIAGNOSIS  Your caregiver will do a physical exam. X-rays may be taken to look for fractures. However, minor rib fractures may not show up on X-rays until a few days after the injury. If a more serious injury is suspected, further imaging tests may be done. This may include ultrasounds, computed tomography (CT) scans, or magnetic resonance imaging (MRI). TREATMENT  Treatment depends on the severity of your injury. Your caregiver may prescribe pain medicines and deep breathing exercises. HOME CARE INSTRUCTIONS  Limit your activities until you can move around without much pain.  Do not do any strenuous work until your injury is healed.  Put ice on the injured area.  Put ice in a plastic bag.  Place a towel between your skin and the bag.  Leave the ice on for 15-20 minutes, 03-04 times a  day.  You may wear a rib belt as directed by your caregiver to reduce pain.  Practice deep breathing as directed by your caregiver to keep your lungs clear.  Only take over-the-counter or prescription medicines for pain, fever, or discomfort as directed by your caregiver. SEEK IMMEDIATE MEDICAL CARE IF:   You have increasing pain or shortness of breath.  You cough up blood.  You have nausea, vomiting, or abdominal pain.  You have a fever.  You feel dizzy, weak, or you faint. MAKE SURE YOU:  Understand these instructions.  Will watch your condition.  Will get help right away if you are not doing well or get worse.   This information is not intended to replace advice given to you by your health care provider. Make sure you discuss any questions you have with your health care provider.   Document Released: 04/21/2004 Document Revised: 04/04/2014 Document Reviewed: 09/10/2014 Elsevier Interactive Patient Education 2016 [place].      Recommend Regular Diet (pt is far  enough out from her gastric surgery for regular diet)  Recommend 500 mg of Calcium Citrate TID  Recommend checking Vitamin D level; if deficient provide 50000 IU of Vitamin D3 daily for 8 weeks)  Continue Multivitamin with minerals daily (pt taking PTA)  Continue 350 mcg of Vitamin D 12 daily (Pt taking PTA)  Encouraged healthful diet with protein, dairy, fruits, and vegetables and discussed recommended vitamin/mineral supplements as above

## 2015-12-03 ENCOUNTER — Telehealth (HOSPITAL_COMMUNITY): Payer: Self-pay

## 2015-12-03 NOTE — Telephone Encounter (Signed)
Made appt for 3/20

## 2016-03-24 ENCOUNTER — Other Ambulatory Visit: Payer: Self-pay | Admitting: Family Medicine

## 2016-03-24 DIAGNOSIS — E010 Iodine-deficiency related diffuse (endemic) goiter: Secondary | ICD-10-CM

## 2016-04-06 ENCOUNTER — Ambulatory Visit
Admission: RE | Admit: 2016-04-06 | Discharge: 2016-04-06 | Disposition: A | Discharge status: Home / Self Care | Payer: 59 | Source: Ambulatory Visit | Attending: Family Medicine | Admitting: Family Medicine

## 2016-04-06 DIAGNOSIS — E010 Iodine-deficiency related diffuse (endemic) goiter: Secondary | ICD-10-CM

## 2016-04-06 DIAGNOSIS — E041 Nontoxic single thyroid nodule: Secondary | ICD-10-CM | POA: Diagnosis not present

## 2016-04-19 ENCOUNTER — Other Ambulatory Visit: Payer: Self-pay | Admitting: Family Medicine

## 2016-04-19 DIAGNOSIS — E041 Nontoxic single thyroid nodule: Secondary | ICD-10-CM

## 2016-04-20 ENCOUNTER — Other Ambulatory Visit (HOSPITAL_COMMUNITY)
Admission: RE | Admit: 2016-04-20 | Discharge: 2016-04-20 | Disposition: A | Discharge status: Home / Self Care | Payer: 59 | Source: Ambulatory Visit | Attending: Physician Assistant | Admitting: Physician Assistant

## 2016-04-20 ENCOUNTER — Ambulatory Visit
Admission: RE | Admit: 2016-04-20 | Discharge: 2016-04-20 | Disposition: A | Discharge status: Home / Self Care | Payer: 59 | Source: Ambulatory Visit | Attending: Family Medicine | Admitting: Family Medicine

## 2016-04-20 DIAGNOSIS — E041 Nontoxic single thyroid nodule: Secondary | ICD-10-CM | POA: Insufficient documentation

## 2016-04-20 NOTE — Procedures (Signed)
PROCEDURE SUMMARY:  Using direct ultrasound guidance, 4 passes were made using 25 g needles into the nodule within the right lobe of the thyroid.   Ultrasound was used to confirm needle placements on all occasions.   Specimens were sent to Pathology for analysis.  Jaeden Westbay S Kaylena Pacifico PA-C 04/20/2016 10:30 AM

## 2016-05-18 DIAGNOSIS — D2261 Melanocytic nevi of right upper limb, including shoulder: Secondary | ICD-10-CM | POA: Diagnosis not present

## 2016-05-18 DIAGNOSIS — D224 Melanocytic nevi of scalp and neck: Secondary | ICD-10-CM | POA: Diagnosis not present

## 2016-05-18 DIAGNOSIS — D2262 Melanocytic nevi of left upper limb, including shoulder: Secondary | ICD-10-CM | POA: Diagnosis not present

## 2016-06-21 DIAGNOSIS — Z9884 Bariatric surgery status: Secondary | ICD-10-CM | POA: Diagnosis not present

## 2016-06-21 DIAGNOSIS — Z0000 Encounter for general adult medical examination without abnormal findings: Secondary | ICD-10-CM | POA: Diagnosis not present

## 2016-09-01 DIAGNOSIS — S70362A Insect bite (nonvenomous), left thigh, initial encounter: Secondary | ICD-10-CM | POA: Diagnosis not present

## 2016-09-01 DIAGNOSIS — L03116 Cellulitis of left lower limb: Secondary | ICD-10-CM | POA: Diagnosis not present

## 2016-09-09 ENCOUNTER — Encounter: Payer: Self-pay | Admitting: Podiatry

## 2016-09-09 ENCOUNTER — Ambulatory Visit (INDEPENDENT_AMBULATORY_CARE_PROVIDER_SITE_OTHER): Payer: 59 | Admitting: Podiatry

## 2016-09-09 DIAGNOSIS — M79671 Pain in right foot: Secondary | ICD-10-CM | POA: Diagnosis not present

## 2016-09-09 DIAGNOSIS — Q828 Other specified congenital malformations of skin: Secondary | ICD-10-CM | POA: Diagnosis not present

## 2016-09-09 NOTE — Progress Notes (Signed)
Subjective:    Patient ID: Danford Bad, female   DOB: 61 y.o.   MRN: [id number]   HPI patient presents with multiple nodules underneath the right foot with the distal one becoming more painful recently    Review of Systems  All other systems reviewed and are negative.       Objective:  Physical Exam  Cardiovascular: Intact distal pulses.   Musculoskeletal: Normal range of motion.  Neurological: She is alert.  Skin: Skin is warm.  Nursing note and vitals reviewed.  neurovascular status intact muscle strength adequate range of motion within normal limits with patient found to have 3 different keratotic lesions plantar right that upon debridement show lucent-type core. The are painful from direct pressure     Assessment:    Probable porokeratosis plantar aspect right versus verruca her plantar callus     Plan:    H&P condition reviewed and debridement of lesions accomplished with no iatrogenic bleeding. Patient will be seen back for [place] to recheck again

## 2016-09-09 NOTE — Progress Notes (Signed)
   Subjective:    Patient ID: Debra [name], female    DOB: [birth year], 61 y.o.   MRN: [id number]  HPI    Review of Systems  Skin: Positive for color change.       Objective:   Physical Exam        Assessment & Plan:

## 2016-11-30 DIAGNOSIS — Z1231 Encounter for screening mammogram for malignant neoplasm of breast: Secondary | ICD-10-CM | POA: Diagnosis not present

## 2017-01-11 DIAGNOSIS — F5101 Primary insomnia: Secondary | ICD-10-CM | POA: Diagnosis not present

## 2017-01-11 DIAGNOSIS — Z23 Encounter for immunization: Secondary | ICD-10-CM | POA: Diagnosis not present

## 2017-02-02 DIAGNOSIS — H2513 Age-related nuclear cataract, bilateral: Secondary | ICD-10-CM | POA: Diagnosis not present

## 2017-04-25 ENCOUNTER — Encounter: Payer: Self-pay | Admitting: Family Medicine

## 2017-04-25 ENCOUNTER — Ambulatory Visit (INDEPENDENT_AMBULATORY_CARE_PROVIDER_SITE_OTHER)
Admission: RE | Admit: 2017-04-25 | Discharge: 2017-04-25 | Disposition: A | Discharge status: Home / Self Care | Payer: 59 | Source: Ambulatory Visit | Attending: Family Medicine | Admitting: Family Medicine

## 2017-04-25 ENCOUNTER — Ambulatory Visit: Payer: 59 | Admitting: Family Medicine

## 2017-04-25 VITALS — BP 102/70 | HR 84 | Ht 67.0 in | Wt 194.0 lb

## 2017-04-25 DIAGNOSIS — M79604 Pain in right leg: Secondary | ICD-10-CM | POA: Diagnosis not present

## 2017-04-25 DIAGNOSIS — M5416 Radiculopathy, lumbar region: Secondary | ICD-10-CM

## 2017-04-25 DIAGNOSIS — M545 Low back pain: Secondary | ICD-10-CM | POA: Diagnosis not present

## 2017-04-25 DIAGNOSIS — M1611 Unilateral primary osteoarthritis, right hip: Secondary | ICD-10-CM | POA: Diagnosis not present

## 2017-04-25 MED ORDER — GABAPENTIN 100 MG PO CAPS: 200.0000 mg | ORAL_CAPSULE | Freq: Every day | ORAL | 3 refills | Status: DC

## 2017-04-25 MED ORDER — VITAMIN D (ERGOCALCIFEROL) 1.25 MG (50000 UNIT) PO CAPS: 50000.0000 [IU] | ORAL_CAPSULE | ORAL | 0 refills | Status: AC

## 2017-04-25 MED ORDER — MELOXICAM 15 MG PO TABS: 15.0000 mg | ORAL_TABLET | Freq: Every day | ORAL | 0 refills | Status: DC

## 2017-04-25 NOTE — Patient Instructions (Signed)
Good to see you  Ice is your friend Ice 20 minutes 2 times daily. Usually after activity and before bed. More to the back  Xray downstairs Exercises 3 times a week.  Walking only 3 times a week. No Saturday walk for 2 weeks at least  [employer] orthotics "total support" online would be great  Thigh compression sleeve with activity  Once weekly vitamin D for 12 weeks Meloxicam daily for 10 days then as needed Gabapentin 200mg  at night for nerve pain  See me again in 3 weeks

## 2017-04-25 NOTE — Progress Notes (Signed)
[name] [address], [place] [address] Phone: [phone] Subjective:    I'm seeing this patient by the request  of:    CC: Leg pain  [id number]  Debra [name] is a 62 y.o. female coming in with complaint of right leg pain. She is going to do a pilgrimage with her church. She has been walking more to prepare and states that she has been having a "hot" foot with pain that radiates up in to the leg. She also states that she has lower back pain that seemed to be the initial problem. She does yoga and the pain would go away. She works part time as a Actor at [employer] and standing for 5 hours increases her pain. No history of back issues. Her trip is March 23rd in which she will walk around 10 miles a day.         Past Medical History:  Diagnosis Date  . Depression (emotion)   . Plantar fasciitis, bilateral   . SUI (stress urinary incontinence, female)    Past Surgical History:  Procedure Laterality Date  . LAPAROSCOPIC GASTRIC BYPASS  02/02/2005   Dr. Lise Auer, [place]  . PANNICULECTOMY  2008   after gastric bypass  . PLACEMENT OF BREAST IMPLANTS  2008   Social History   Socioeconomic History  . Marital status: Single    Spouse name: None  . Number of children: None  . Years of education: None  . Highest education level: None  Social Needs  . Financial resource strain: None  . Food insecurity - worry: None  . Food insecurity - inability: None  . Transportation needs - medical: None  . Transportation needs - non-medical: None  Occupational History  . None  Tobacco Use  . Smoking status: Never Smoker  . Smokeless tobacco: Never Used  Substance and Sexual Activity  . Alcohol use: No  . Drug use: No  . Sexual activity: None  Other Topics Concern  . None  Social History Narrative  . None   No Known Allergies No family history on file.  No family history of autoimmune disease   Past medical history, social,  surgical and family history all reviewed in electronic medical record.  No pertanent information unless stated regarding to the chief complaint.   Review of Systems:Review of systems updated and as accurate as of 04/25/17  No headache, visual changes, nausea, vomiting, diarrhea, constipation, dizziness, abdominal pain, skin rash, fevers, chills, night sweats, weight loss, swollen lymph nodes, body aches, joint swelling, chest pain, shortness of breath, mood changes.  Positive muscle aches Objective  Blood pressure 102/70, pulse 84, height 5\' 7"  (1.702 m), weight 194 lb (88 kg), SpO2 97 %. Systems examined below as of 04/25/17   General: No apparent distress alert and oriented x3 mood and affect normal, dressed appropriately.  HEENT: Pupils equal, extraocular movements intact  Respiratory: Patient's speak in full sentences and does not appear short of breath  Cardiovascular: No lower extremity edema, non tender, no erythema  Skin: Warm dry intact with no signs of infection or rash on extremities or on axial skeleton.  Abdomen: Soft nontender  Neuro: Cranial nerves II through XII are intact, neurovascularly intact in all extremities with 2+ DTRs and 2+ pulses.  Lymph: No lymphadenopathy of posterior or anterior cervical chain or axillae bilaterally.  Gait normal with good balance and coordination.  MSK:  Non tender with full range of motion and good stability  and symmetric strength and tone of shoulders, elbows, wrist, knee and ankles bilaterally.  Right hip exam shows the patient does have some mild limitation in internal and external range of motion by 5 degrees.  Some tenderness with flexion of the hip with resistance.  Patient does have more pain in the groin with Corky Sox test.  Less pain with internal rotation.  Negative straight leg test.  Mild to moderate discomfort to palpation in the paraspinal musculature lumbar spine and the right sacroiliac joint  [id number]; 15 additional minutes spent for  Therapeutic exercises as stated in above notes.  This included exercises focusing on stretching, strengthening, with significant focus on eccentric aspects.   Long term goals include an improvement in range of motion, strength, endurance as well as avoiding reinjury. Patient's frequency would include in 1-2 times a day, 3-5 times a week for a duration of 6-12 weeks.Low back exercises that included:  Pelvic tilt/bracing instruction to focus on control of the pelvic girdle and lower abdominal muscles  Glute strengthening exercises, focusing on proper firing of the glutes without engaging the low back muscles Proper stretching techniques for maximum relief for the hamstrings, hip flexors, low back and some rotation where tolerated   Proper technique shown and discussed handout in great detail with ATC.  All questions were discussed and answered.       Impression and Recommendations:     This case required medical decision making of moderate complexity.      Note: This dictation was prepared with Dragon dictation along with smaller phrase technology. Any transcriptional errors that result from this process are unintentional.

## 2017-05-16 NOTE — Progress Notes (Signed)
[name] [address], [place] [address] Phone: [phone] Subjective:     CC: Hip pain follow-up  [id number]  Debra [name] is a 62 y.o. female coming in with complaint of right-sided hip and leg pain.  Was found to have more some radicular symptoms.  Sent for back x-rays.  These were independently visual patient was concerned because she was going to be doing more walking.  Feels the gabapentin has helped out significantly standing has some discomfort in the Achilles area.  Not as much in the knee area.  Active.  Doing the exercises intermittently.      Past Medical History:  Diagnosis Date  . Depression (emotion)   . Plantar fasciitis, bilateral   . SUI (stress urinary incontinence, female)    Past Surgical History:  Procedure Laterality Date  . LAPAROSCOPIC GASTRIC BYPASS  02/02/2005   Dr. Lise Auer, [place]  . PANNICULECTOMY  2008   after gastric bypass  . PLACEMENT OF BREAST IMPLANTS  2008   Social History   Socioeconomic History  . Marital status: Single    Spouse name: None  . Number of children: None  . Years of education: None  . Highest education level: None  Social Needs  . Financial resource strain: None  . Food insecurity - worry: None  . Food insecurity - inability: None  . Transportation needs - medical: None  . Transportation needs - non-medical: None  Occupational History  . None  Tobacco Use  . Smoking status: Never Smoker  . Smokeless tobacco: Never Used  Substance and Sexual Activity  . Alcohol use: No  . Drug use: No  . Sexual activity: None  Other Topics Concern  . None  Social History Narrative  . None   No Known Allergies No family history on file.   Past medical history, social, surgical and family history all reviewed in electronic medical record.  No pertanent information unless stated regarding to the chief complaint.   Review of Systems:Review of systems updated and as  accurate as of 05/17/17  No headache, visual changes, nausea, vomiting, diarrhea, constipation, dizziness, abdominal pain, skin rash, fevers, chills, night sweats, weight loss, swollen lymph nodes, body aches, joint swelling, muscle aches, chest pain, shortness of breath, mood changes.    Objective  Blood pressure 100/80, pulse 67, height 5\' 8"  (1.727 m), weight 198 lb (89.8 kg), SpO2 98 %. Systems examined below as of 05/17/17   General: No apparent distress alert and oriented x3 mood and affect normal, dressed appropriately.  HEENT: Pupils equal, extraocular movements intact  Respiratory: Patient's speak in full sentences and does not appear short of breath  Cardiovascular: No lower extremity edema, non tender, no erythema  Skin: Warm dry intact with no signs of infection or rash on extremities or on axial skeleton.  Abdomen: Soft nontender  Neuro: Cranial nerves II through XII are intact, neurovascularly intact in all extremities with 2+ DTRs and 2+ pulses.  Lymph: No lymphadenopathy of posterior or anterior cervical chain or axillae bilaterally.  Gait normal with good balance and coordination.  MSK:  Non tender with full range of motion and good stability and symmetric strength and tone of shoulders, elbows, wrist, hip, knee and ankles bilaterally.  Mild to moderate arthritic changes of multiple joints including knees Patient neurovascularly intact distally.  Mild soreness of the Spinal soft some mild limitation in range of motion.  His.  Some tingling though with repetitive extension of  the left knee.  Neurovascularly intact distally.    Impression and Recommendations:     This case required medical decision making of moderate complexity.      Note: This dictation was prepared with Dragon dictation along with smaller phrase technology. Any transcriptional errors that result from this process are unintentional.

## 2017-05-17 ENCOUNTER — Ambulatory Visit: Payer: 59 | Admitting: Family Medicine

## 2017-05-17 ENCOUNTER — Encounter: Payer: Self-pay | Admitting: Family Medicine

## 2017-05-17 DIAGNOSIS — M79604 Pain in right leg: Secondary | ICD-10-CM

## 2017-05-17 MED ORDER — MELOXICAM 15 MG PO TABS: 15.0000 mg | ORAL_TABLET | Freq: Every day | ORAL | 1 refills | Status: DC

## 2017-05-17 NOTE — Patient Instructions (Addendum)
Good to see you  Overall not that bad I think you are making progress Add iron 65mg  daily if you can with 500mg  of vitamin C but if constipation then drop to 3 times a week  Continue the melxoicam  Continue the gabapentin  Vega sport protein and eat within 30 minutes of working out.  OK to increase 1.5 miles a week  See me again in 4-5 weeks, right before your trip!

## 2017-05-17 NOTE — Assessment & Plan Note (Signed)
Still believe the patient's right leg pain is likely multifactorial.  Patient does have history of gastric bypass could be contributing to some of the reabsorption problem.  Encourage patient to continue the gabapentin and meloxicam but monitor for any type of stomach issues.  Once weekly vitamin D given for muscle strength and endurance.  Discussed other over-the-counter medications that can be helpful.  Follow-up again 4 weeks before patient goes on her trip.

## 2017-06-11 NOTE — Progress Notes (Signed)
[facility] [address], [place] [address] Phone: [phone] Subjective:    I'm seeing this patient by the request  of:    CC:   Back and leg pain follow-up  [id number]  Debra [name] is a 62 y.o. female coming in with complaint of lower back back pain. She has had a decrease in pain and the pain went away completely two weeks after her last visit with [place]. She has not tried to walk long distances yet. The longest that she has gone is 4.5 miles.  Patient recently had an illness that has not been walking quite as much.  Patient states that the gabapentin has been helpful.     Past Medical History:  Diagnosis Date  . Depression (emotion)   . Plantar fasciitis, bilateral   . SUI (stress urinary incontinence, female)    Past Surgical History:  Procedure Laterality Date  . LAPAROSCOPIC GASTRIC BYPASS  02/02/2005   Dr. Lise Auer, [place]  . PANNICULECTOMY  2008   after gastric bypass  . PLACEMENT OF BREAST IMPLANTS  2008   Social History   Socioeconomic History  . Marital status: Single    Spouse name: None  . Number of children: None  . Years of education: None  . Highest education level: None  Social Needs  . Financial resource strain: None  . Food insecurity - worry: None  . Food insecurity - inability: None  . Transportation needs - medical: None  . Transportation needs - non-medical: None  Occupational History  . None  Tobacco Use  . Smoking status: Never Smoker  . Smokeless tobacco: Never Used  Substance and Sexual Activity  . Alcohol use: No  . Drug use: No  . Sexual activity: None  Other Topics Concern  . None  Social History Narrative  . None   No Known Allergies No family history on file.  No family history of autoimmune   Past medical history, social, surgical and family history all reviewed in electronic medical record.  No pertanent information unless stated regarding to the chief complaint.   Review  of Systems:Review of systems updated and as accurate as of 06/12/17  No headache, visual changes, nausea, vomiting, diarrhea, constipation, dizziness, abdominal pain, skin rash, fevers, chills, night sweats, weight loss, swollen lymph nodes, body aches, joint swelling, muscle aches, chest pain, shortness of breath, mood changes.   Objective  Blood pressure 106/66, pulse (!) 57, height 5\' 8"  (1.727 m), weight 193 lb (87.5 kg), SpO2 91 %. Systems examined below as of 06/12/17   General: No apparent distress alert and oriented x3 mood and affect normal, dressed appropriately.  HEENT: Pupils equal, extraocular movements intact  Respiratory: Patient's speak in full sentences and does not appear short of breath  Cardiovascular: No lower extremity edema, non tender, no erythema  Skin: Warm dry intact with no signs of infection or rash on extremities or on axial skeleton.  Abdomen: Soft nontender  Neuro: Cranial nerves II through XII are intact, neurovascularly intact in all extremities with 2+ DTRs and 2+ pulses.  Lymph: No lymphadenopathy of posterior or anterior cervical chain or axillae bilaterally.  Gait normal with good balance and coordination.  MSK:  Non tender with full range of motion and good stability and symmetric strength and tone of shoulders, elbows, wrist, hip, and ankles bilaterally.  Knee exam bilaterally does show some arthritic changes. Back exam shows mild positive Corky Sox but negative straight leg test.  Neurovascularly intact distally with full strength.  Deep tendon reflexes intact    Impression and Recommendations:     This case required medical decision making of moderate complexity.      Note: This dictation was prepared with Dragon dictation along with smaller phrase technology. Any transcriptional errors that result from this process are unintentional.

## 2017-06-12 ENCOUNTER — Encounter: Payer: Self-pay | Admitting: Family Medicine

## 2017-06-12 ENCOUNTER — Ambulatory Visit: Payer: 59 | Admitting: Family Medicine

## 2017-06-12 DIAGNOSIS — M79604 Pain in right leg: Secondary | ICD-10-CM | POA: Diagnosis not present

## 2017-06-12 MED ORDER — PREDNISONE 50 MG PO TABS: 50.0000 mg | ORAL_TABLET | Freq: Every day | ORAL | 0 refills | Status: DC

## 2017-06-12 NOTE — Assessment & Plan Note (Signed)
Still believe that likely secondary to more of lumbar radiculopathy.  We discussed icing regimen and home exercises.  We discussed which activities to do which wants to avoid.  Increase activity as tolerated.  Discussed icing regimen.  Home exercises, which activities will be beneficial for her trip.  Follow-up after her trip for further evaluation and treatment.

## 2017-06-12 NOTE — Patient Instructions (Signed)
Good to see you  Debra [name] is your friend.  Prednisone daily for 5 days IF the pain is there on the trip  Iron at least 3 times a week  Vitamin D 2000 IU dialy  See me again when you get back just in case.

## 2017-07-03 ENCOUNTER — Ambulatory Visit: Payer: 59 | Admitting: Family Medicine

## 2017-10-02 ENCOUNTER — Ambulatory Visit
Admission: RE | Admit: 2017-10-02 | Discharge: 2017-10-02 | Disposition: A | Discharge status: Home / Self Care | Payer: No Typology Code available for payment source | Source: Ambulatory Visit | Attending: Family Medicine | Admitting: Family Medicine

## 2017-10-02 ENCOUNTER — Other Ambulatory Visit: Payer: Self-pay | Admitting: Family Medicine

## 2017-10-02 DIAGNOSIS — S93491A Sprain of other ligament of right ankle, initial encounter: Secondary | ICD-10-CM

## 2017-10-05 DIAGNOSIS — S93491D Sprain of other ligament of right ankle, subsequent encounter: Secondary | ICD-10-CM | POA: Diagnosis not present

## 2017-10-12 DIAGNOSIS — S93601D Unspecified sprain of right foot, subsequent encounter: Secondary | ICD-10-CM | POA: Diagnosis not present

## 2017-10-12 DIAGNOSIS — S93401D Sprain of unspecified ligament of right ankle, subsequent encounter: Secondary | ICD-10-CM | POA: Diagnosis not present

## 2017-10-13 ENCOUNTER — Other Ambulatory Visit: Payer: Self-pay | Admitting: Family Medicine

## 2017-10-13 ENCOUNTER — Ambulatory Visit
Admission: RE | Admit: 2017-10-13 | Discharge: 2017-10-13 | Disposition: A | Discharge status: Home / Self Care | Payer: Worker's Compensation | Source: Ambulatory Visit | Attending: Family Medicine | Admitting: Family Medicine

## 2017-10-13 DIAGNOSIS — S93601D Unspecified sprain of right foot, subsequent encounter: Secondary | ICD-10-CM

## 2017-10-13 DIAGNOSIS — W19XXXD Unspecified fall, subsequent encounter: Secondary | ICD-10-CM

## 2017-10-18 ENCOUNTER — Ambulatory Visit: Payer: No Typology Code available for payment source | Admitting: Family Medicine

## 2017-10-21 IMAGING — CR DG CHEST 1V PORT
1 series · 1 of 1 positions shown · non-contrast
Comparison: 11/27/2015

CLINICAL DATA: Followup pneumothorax.  Right-sided thoracic trauma.

EXAM:
PORTABLE CHEST 1 VIEW

[AP]
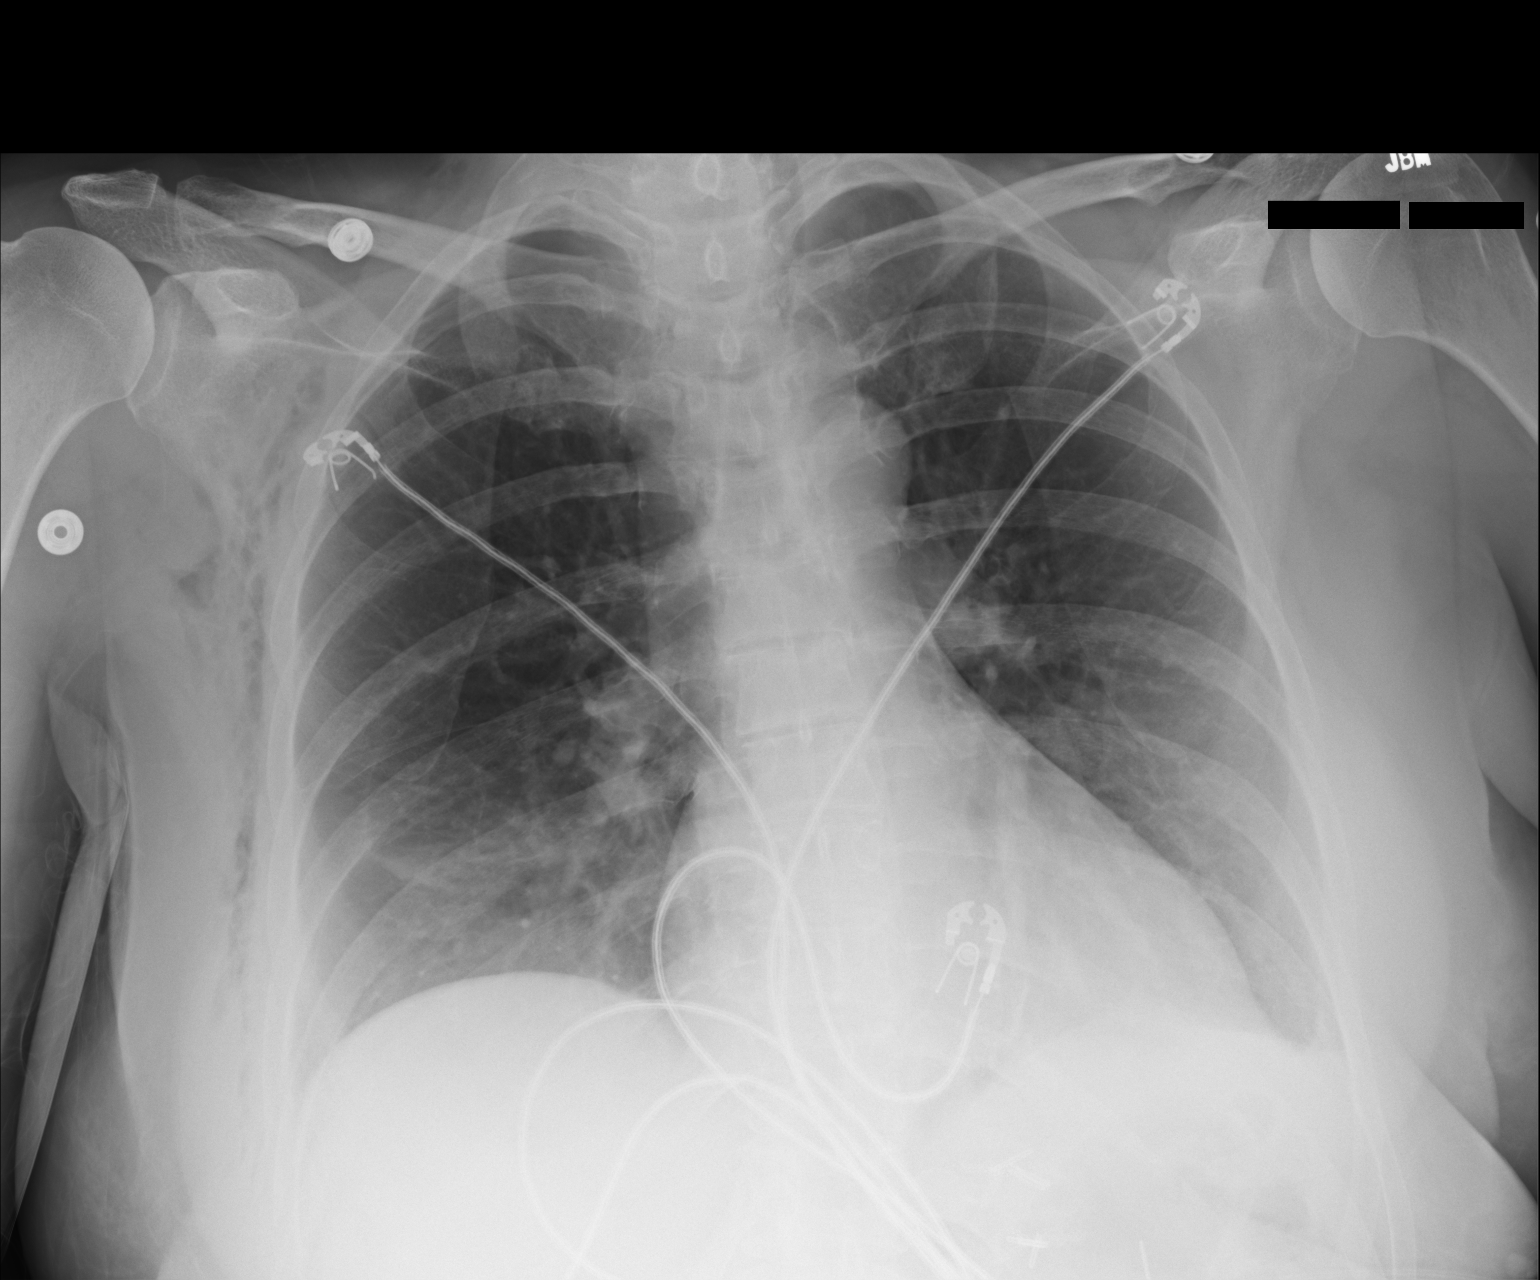

[1 of 1 positions shown; findings below may reference images not displayed]

FINDINGS: Persistent small right-sided pneumothorax. Stable subcutaneous
emphysema. Streaky bibasilar atelectasis but no definite infiltrates
or effusions.
IMPRESSION: Stable small right-sided pneumothorax estimated at 5%.

Stable subcutaneous emphysema.

Streaky bibasilar atelectasis.

## 2017-12-06 DIAGNOSIS — Z1231 Encounter for screening mammogram for malignant neoplasm of breast: Secondary | ICD-10-CM | POA: Diagnosis not present

## 2017-12-07 ENCOUNTER — Other Ambulatory Visit (HOSPITAL_COMMUNITY)
Admission: RE | Admit: 2017-12-07 | Discharge: 2017-12-07 | Disposition: A | Discharge status: Home / Self Care | Payer: 59 | Source: Ambulatory Visit | Attending: Family Medicine | Admitting: Family Medicine

## 2017-12-07 ENCOUNTER — Other Ambulatory Visit: Payer: Self-pay | Admitting: Family Medicine

## 2017-12-07 DIAGNOSIS — Z0000 Encounter for general adult medical examination without abnormal findings: Secondary | ICD-10-CM | POA: Diagnosis not present

## 2017-12-11 DIAGNOSIS — E041 Nontoxic single thyroid nodule: Secondary | ICD-10-CM | POA: Diagnosis not present

## 2017-12-11 DIAGNOSIS — Z9884 Bariatric surgery status: Secondary | ICD-10-CM | POA: Diagnosis not present

## 2017-12-11 LAB — CYTOLOGY - PAP
Diagnosis: NEGATIVE
HPV: NOT DETECTED

## 2017-12-13 DIAGNOSIS — R7301 Impaired fasting glucose: Secondary | ICD-10-CM | POA: Diagnosis not present

## 2018-03-30 DIAGNOSIS — H43393 Other vitreous opacities, bilateral: Secondary | ICD-10-CM | POA: Diagnosis not present

## 2018-04-24 ENCOUNTER — Ambulatory Visit: Payer: Self-pay | Admitting: Family Medicine

## 2018-05-01 NOTE — Progress Notes (Signed)
[facility] [address] [place], [place] [address] Phone: [phone] Subjective:    I Debra [name] am serving as a Education administrator for Dr. Hulan Saas.   CC: Bilateral foot pain  WHQ:[id number]      Updated 05/02/2018  Debra [name] is a 63 y.o. female coming in with complaint of right leg pain. Numbness in the toes. No back or hip pain. Plans to go hiking soon. Does Yoga for exercise. Believes it has something to do with her feet.  Onset- Chronic 3-4 months     Severity-5 out of 10 Patient states that it does not hurt a significant amount.  More of a numbness than anything else.  Seems to be ongoing.  Possibly worsening.  Patient seen previously nearly a year ago for right leg pain that seem to be lumbar radiculopathy.  Feels like her back is been doing very well.    Past Medical History:  Diagnosis Date  . Depression (emotion)   . Plantar fasciitis, bilateral   . SUI (stress urinary incontinence, female)    Past Surgical History:  Procedure Laterality Date  . LAPAROSCOPIC GASTRIC BYPASS  02/02/2005   Dr. Lise Auer, [place]  . PANNICULECTOMY  2008   after gastric bypass  . PLACEMENT OF BREAST IMPLANTS  2008   Social History   Socioeconomic History  . Marital status: Single    Spouse name: Not on file  . Number of children: Not on file  . Years of education: Not on file  . Highest education level: Not on file  Occupational History  . Not on file  Social Needs  . Financial resource strain: Not on file  . Food insecurity:    Worry: Not on file    Inability: Not on file  . Transportation needs:    Medical: Not on file    Non-medical: Not on file  Tobacco Use  . Smoking status: Never Smoker  . Smokeless tobacco: Never Used  Substance and Sexual Activity  . Alcohol use: No  . Drug use: No  . Sexual activity: Not on file  Lifestyle  . Physical activity:    Days per week: Not on file    Minutes per session: Not on file  .  Stress: Not on file  Relationships  . Social connections:    Talks on phone: Not on file    Gets together: Not on file    Attends religious service: Not on file    Active member of club or organization: Not on file    Attends meetings of clubs or organizations: Not on file    Relationship status: Not on file  Other Topics Concern  . Not on file  Social History Narrative  . Not on file   No Known Allergies No family history on file.  Current Outpatient Medications (Endocrine & Metabolic):  .  predniSONE (DELTASONE) 50 MG tablet, Take 1 tablet (50 mg total) by mouth daily.    Current Outpatient Medications (Analgesics):  .  acetaminophen (TYLENOL) 500 MG tablet, Take 2 tablets (1,000 mg total) by mouth 3 (three) times daily. .  meloxicam (MOBIC) 15 MG tablet, Take 1 tablet (15 mg total) by mouth daily.   Current Outpatient Medications (Other):  Marland Kitchen  Cholecalciferol (VITAMIN D) 2000 units CAPS, Take by mouth. .  gabapentin (NEURONTIN) 100 MG capsule, Take 2 capsules (200 mg total) by mouth at bedtime. .  Multiple Vitamin (MULTIVITAMIN) tablet, Take 1 tablet by mouth daily. Marland Kitchen  Omega-3 Fatty Acids (FISH OIL) 500 MG CAPS, Take by mouth. .  thiamine (VITAMIN B-1) 50 MG tablet, Take 50 mg by mouth daily. .  Vitamin D, Ergocalciferol, (DRISDOL) 50000 units CAPS capsule, Take 1 capsule (50,000 Units total) by mouth every 7 (seven) days.    Past medical history, social, surgical and family history all reviewed in electronic medical record.  No pertanent information unless stated regarding to the chief complaint.   Review of Systems:  No headache, visual changes, nausea, vomiting, diarrhea, constipation, dizziness, abdominal pain, skin rash, fevers, chills, night sweats, weight loss, swollen lymph nodes, body aches, joint swelling,  chest pain, shortness of breath, mood changes.  Positive muscle aches  Objective  Blood pressure 104/80, pulse 88, height 5\' 8"  (1.727 m), weight 182 lb  (82.6 kg), SpO2 96 %.    General: No apparent distress alert and oriented x3 mood and affect normal, dressed appropriately.  HEENT: Pupils equal, extraocular movements intact  Respiratory: Patient's speak in full sentences and does not appear short of breath  Cardiovascular: No lower extremity edema, non tender, no erythema  Skin: Warm dry intact with no signs of infection or rash on extremities or on axial skeleton.  Abdomen: Soft nontender  Neuro: Cranial nerves II through XII are intact, neurovascularly intact in all extremities with 2+ DTRs and 2+ pulses.  Lymph: No lymphadenopathy of posterior or anterior cervical chain or axillae bilaterally.  Gait normal with good balance and coordination.  MSK:  Non tender with full range of motion and good stability and symmetric strength and tone of shoulders, elbows, wrist, hip, knee and ankles bilaterally.  Back Exam:  Inspection: Unremarkable  Motion: Flexion 45 deg, Extension 25 deg, Side Bending to 35 deg bilaterally,  Rotation to 35 deg bilaterally  SLR laying: Negative  XSLR laying: Negative  Palpable tenderness: Tender to palpation in the paraspinal musculature.Marland Kitchen FABER: negative. Sensory change: Gross sensation intact to all lumbar and sacral dermatomes.  Reflexes: 2+ at both patellar tendons, 2+ at achilles tendons, Babinski's downgoing.  Strength at foot  Plantar-flexion: 5/5 Dorsi-flexion: 5/5 Eversion: 5/5 Inversion: 5/5  Leg strength  Quad: 5/5 Hamstring: 5/5 Hip flexor: 5/5 Hip abductors: 5/5  Gait unremarkable.  Bilateral foot exam shows breakdown of the transverse arch.  Patient is explained return the first and second toes.  Negative squeeze test.   Impression and Recommendations:     This case required medical decision making of moderate complexity. The above documentation has been reviewed and is accurate and complete Lyndal Pulley, DO       Note: This dictation was prepared with Dragon dictation along with  smaller phrase technology. Any transcriptional errors that result from this process are unintentional.

## 2018-05-02 ENCOUNTER — Encounter: Payer: Self-pay | Admitting: Family Medicine

## 2018-05-02 ENCOUNTER — Ambulatory Visit: Payer: 59 | Admitting: Family Medicine

## 2018-05-02 VITALS — BP 104/80 | HR 88 | Ht 68.0 in | Wt 182.0 lb

## 2018-05-02 DIAGNOSIS — M216X9 Other acquired deformities of unspecified foot: Secondary | ICD-10-CM | POA: Diagnosis not present

## 2018-05-02 DIAGNOSIS — E538 Deficiency of other specified B group vitamins: Secondary | ICD-10-CM

## 2018-05-02 MED ORDER — CYANOCOBALAMIN 1000 MCG/ML IJ SOLN
1000.0000 ug | Freq: Once | INTRAMUSCULAR | Status: AC
  Administered 2018-05-02: 1000 ug via INTRAMUSCULAR

## 2018-05-02 NOTE — Patient Instructions (Signed)
Good to see you  Exercises 3 times a week.  B12 injection today to try  B6 200mg  daily  Iron 65mg  daily to add to your vitamin C Other then Yoga try to avoid being barefoot.  See me again in 4 weeks to make sure much better

## 2018-05-02 NOTE — Assessment & Plan Note (Signed)
Bilateral transverse arch breakdown.  Some numbness in the toes.  Could be B12 deficiency with patient has had previously.  Injection given today.  Gabapentin given.  Peripheral neuropathy is within the differential.  Not likely lumbar radiculopathy.  Discussed vitamin D deficiency as well.  May need laboratory work-up.  Will discuss at follow-up in 4 weeks.  Home exercises given for transverse arch.

## 2018-05-14 ENCOUNTER — Other Ambulatory Visit: Payer: Self-pay | Admitting: Family Medicine

## 2018-05-14 NOTE — Telephone Encounter (Signed)
Requested medication (s) are due for refill today -yes- if to continue  Requested medication (s) are on the active medication list -yes  Future visit scheduled - no  Last refill: 04/25/17  Notes to clinic: Patient is request refill of expired medication - sent for PCP review to continue  Requested Prescriptions  Pending Prescriptions Disp Refills   gabapentin (NEURONTIN) 100 MG capsule 60 capsule 3    Sig: Take 2 capsules (200 mg total) by mouth at bedtime.     Neurology: Anticonvulsants - gabapentin Passed - 05/14/2018  9:46 AM      Passed - Valid encounter within last 12 months    Recent Outpatient Visits          1 week ago B12 deficiency   [place], [place], DO   11 months ago Right leg pain   [place], [place], DO   12 months ago Right leg pain   [place], [place], DO   1 year ago Lumbar radiculopathy   [place], DO              Requested Prescriptions  Pending Prescriptions Disp Refills   gabapentin (NEURONTIN) 100 MG capsule 60 capsule 3    Sig: Take 2 capsules (200 mg total) by mouth at bedtime.     Neurology: Anticonvulsants - gabapentin Passed - 05/14/2018  9:46 AM      Passed - Valid encounter within last 12 months    Recent Outpatient Visits          1 week ago B12 deficiency   [place], [place], DO   11 months ago Right leg pain   [place], [place], DO   12 months ago Right leg pain   [place], [place], DO   1 year ago Lumbar radiculopathy   [place], [place], DO

## 2018-05-14 NOTE — Telephone Encounter (Signed)
Copied from [place] [phone]. Topic: Quick Communication - Rx Refill/Question >> May 14, 2018  9:16 AM Reyne Dumas L wrote: Medication: gabapentin (NEURONTIN) 100 MG capsule  Has the patient contacted their pharmacy? Yes - no refills left (Agent: If no, request that the patient contact the pharmacy for the refill.) (Agent: If yes, when and what did the pharmacy advise?)  Preferred Pharmacy (with phone number or street name): [facility], [place]. [phone] (Phone) [phone] (Fax)  Agent: Please be advised that RX refills may take up to 3 business days. We ask that you follow-up with your pharmacy.

## 2018-05-15 MED ORDER — GABAPENTIN 100 MG PO CAPS: 200.0000 mg | ORAL_CAPSULE | Freq: Every day | ORAL | 1 refills | Status: DC

## 2019-06-06 ENCOUNTER — Ambulatory Visit: Payer: 59 | Attending: Family

## 2019-06-06 DIAGNOSIS — Z23 Encounter for immunization: Secondary | ICD-10-CM

## 2019-06-06 NOTE — Progress Notes (Signed)
   Covid-19 Vaccination Clinic  Name:  Debra [name]    MRN: [id number] DOB: [birth year]  06/06/2019  Debra [name] was observed post Covid-19 immunization for 15 minutes without incident. She was provided with Vaccine Information Sheet and instruction to access the V-Safe system.   Debra [name] was instructed to call 911 with any severe reactions post vaccine: Marland Kitchen Difficulty breathing  . Swelling of face and throat  . A fast heartbeat  . A bad rash all over body  . Dizziness and weakness   Immunizations Administered    Name Date Dose VIS Date Route   Moderna COVID-19 Vaccine 06/06/2019  1:33 PM 0.5 mL 02/26/2019 Intramuscular   Manufacturer: Moderna   LotKN:[id number]   [place]BE:[id number]

## 2019-07-09 ENCOUNTER — Ambulatory Visit: Payer: 59 | Attending: Family

## 2019-07-09 DIAGNOSIS — Z23 Encounter for immunization: Secondary | ICD-10-CM

## 2019-07-09 NOTE — Progress Notes (Signed)
   Covid-19 Vaccination Clinic  Name:  Debra [name]    MRN: [id number] DOB: 08/29/1955  07/09/2019  [name] was observed post Covid-19 immunization for 15 minutes without incident. She was provided with Vaccine Information Sheet and instruction to access the V-Safe system.   [name] was instructed to call 911 with any severe reactions post vaccine: Marland Kitchen Difficulty breathing  . Swelling of face and throat  . A fast heartbeat  . A bad rash all over body  . Dizziness and weakness   Immunizations Administered    Name Date Dose VIS Date Route   Moderna COVID-19 Vaccine 07/09/2019  1:48 PM 0.5 mL 02/26/2019 Intramuscular   Manufacturer: Moderna   Lot: [id number]   [id number]

## 2019-09-06 IMAGING — CR DG FOOT 2V*R*
2 series · 2 of 2 positions shown · non-contrast
Comparison: None

CLINICAL DATA: Fell on 10/02/2017, sprain RIGHT foot, question
fracture, having pain, swelling and bruising, inability to bear
weight

EXAM:
RIGHT FOOT - 2 VIEW

[x foot ap right]
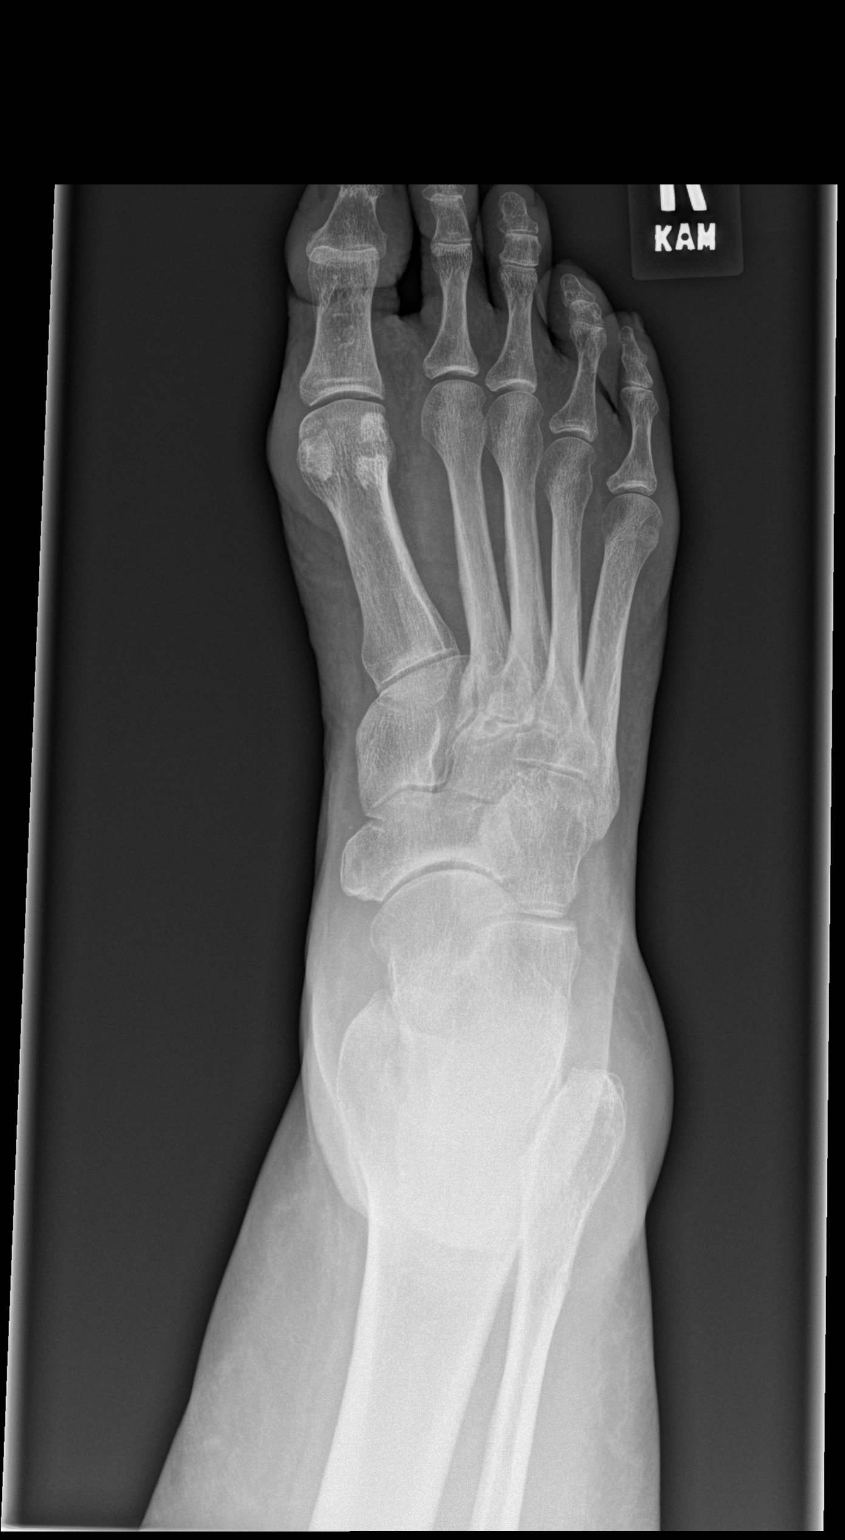

[x foot lat right]
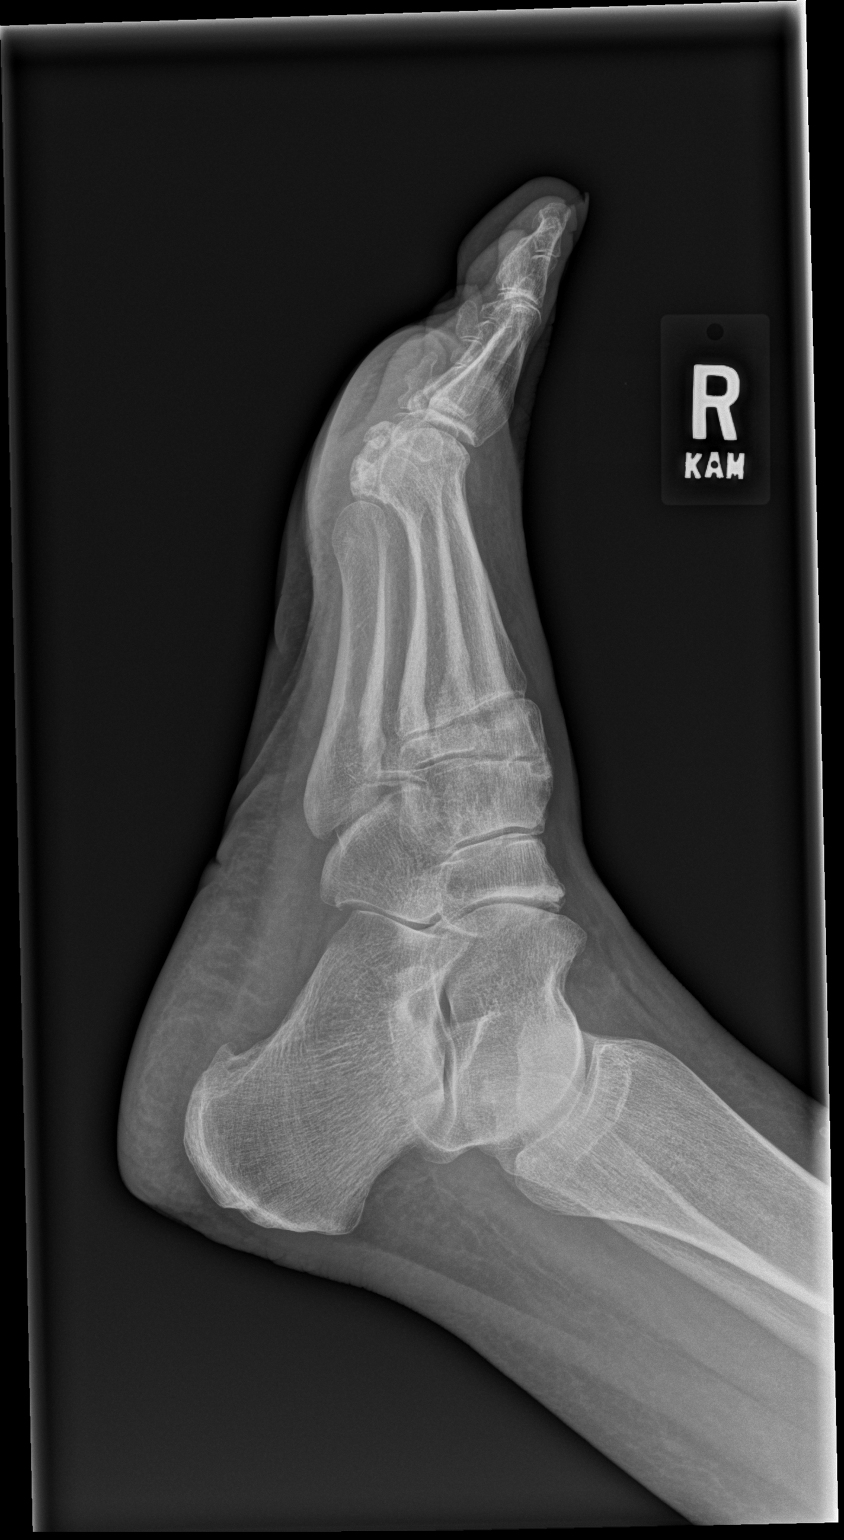

[2 of 2 positions shown; findings below may reference images not displayed]

FINDINGS: Mild osseous demineralization.

Joint spaces preserved.

No acute fracture, dislocation, or bone destruction.

Small plantar calcaneal spur.
IMPRESSION: No acute osseous abnormalities.

## 2019-11-18 ENCOUNTER — Encounter: Payer: Self-pay | Admitting: Family Medicine

## 2019-11-18 ENCOUNTER — Encounter: Payer: Self-pay | Admitting: Neurology

## 2019-11-18 ENCOUNTER — Ambulatory Visit (INDEPENDENT_AMBULATORY_CARE_PROVIDER_SITE_OTHER): Payer: 59 | Admitting: Family Medicine

## 2019-11-18 ENCOUNTER — Other Ambulatory Visit: Payer: Self-pay

## 2019-11-18 VITALS — BP 116/80 | HR 85 | Ht 68.0 in | Wt 182.0 lb

## 2019-11-18 DIAGNOSIS — R200 Anesthesia of skin: Secondary | ICD-10-CM

## 2019-11-18 DIAGNOSIS — M216X9 Other acquired deformities of unspecified foot: Secondary | ICD-10-CM

## 2019-11-18 NOTE — Progress Notes (Signed)
Debra [name] Phone: [phone] Subjective:   Debra [name], am serving as a scribe for Dr. Hulan [name]. This visit occurred during the SARS-CoV-2 public health emergency.  Safety protocols were in place, including screening questions prior to the visit, additional usage of staff PPE, and extensive cleaning of exam room while observing appropriate contact time as indicated for disinfecting solutions.   I'm seeing this patient by the request  of:  Debra Stains, MD  CC: bilateral foot pain but more numbness   CXK:[id number]  Debra [name] is a 64 y.o. female coming in with complaint of foot pain. Last seen for loss of transverse arch in 04/2018. Patient states that she has constant numbness in all of her toes. PCP tested sensation and patient could feel pin pricks. Using Cymbalta instead of gabapentin. Helping somewhat but does diminish throughout the day. Is about to go hiking in [place]. PCP thinks she has peripheral neuropathy. Denies any back or hip pain.        Past Medical History:  Diagnosis Date  . Depression (emotion)   . Plantar fasciitis, bilateral   . SUI (stress urinary incontinence, female)    Past Surgical History:  Procedure Laterality Date  . LAPAROSCOPIC GASTRIC BYPASS  02/02/2005   Dr. Lise [name], [place]  . PANNICULECTOMY  2008   after gastric bypass  . PLACEMENT OF BREAST IMPLANTS  2008   Social History   Socioeconomic History  . Marital status: Single    Spouse name: Not on file  . Number of children: Not on file  . Years of education: Not on file  . Highest education level: Not on file  Occupational History  . Not on file  Tobacco Use  . Smoking status: Never Smoker  . Smokeless tobacco: Never Used  Substance and Sexual Activity  . Alcohol use: [name]  . Drug use: [name]  . Sexual activity: Not on file  Other Topics Concern  . Not on file  Social History Narrative  . Not on  file   Social Determinants of Health   Financial Resource Strain:   . Difficulty of Paying Living Expenses: Not on file  Food Insecurity:   . Worried About Charity fundraiser in the Last Year: Not on file  . Ran Out of Food in the Last Year: Not on file  Transportation Needs:   . Lack of Transportation (Medical): Not on file  . Lack of Transportation (Non-Medical): Not on file  Physical Activity:   . Days of Exercise per Week: Not on file  . Minutes of Exercise per Session: Not on file  Stress:   . Feeling of Stress : Not on file  Social Connections:   . Frequency of Communication with Friends and Family: Not on file  . Frequency of Social Gatherings with Friends and Family: Not on file  . Attends Religious Services: Not on file  . Active Member of Clubs or Organizations: Not on file  . Attends Archivist Meetings: Not on file  . Marital Status: Not on file   [name] Known Allergies [name] family history on file.  Current Outpatient Medications (Endocrine & Metabolic):  .  predniSONE (DELTASONE) 50 MG tablet, Take 1 tablet (50 mg total) by mouth daily.    Current Outpatient Medications (Analgesics):  .  acetaminophen (TYLENOL) 500 MG tablet, Take 2 tablets (1,000 mg total) by mouth 3 (three) times daily. .  meloxicam (MOBIC)  15 MG tablet, Take 1 tablet (15 mg total) by mouth daily.   Current Outpatient Medications (Other):  Marland Kitchen  Cholecalciferol (VITAMIN D) 2000 units CAPS, Take by mouth. .  gabapentin (NEURONTIN) 100 MG capsule, Take 2 capsules (200 mg total) by mouth at bedtime. .  Multiple Vitamin (MULTIVITAMIN) tablet, Take 1 tablet by mouth daily. .  Omega-3 Fatty Acids (FISH OIL) 500 MG CAPS, Take by mouth. .  thiamine (VITAMIN B-1) 50 MG tablet, Take 50 mg by mouth daily. .  Vitamin D, Ergocalciferol, (DRISDOL) 50000 units CAPS capsule, Take 1 capsule (50,000 Units total) by mouth every 7 (seven) days.   Reviewed prior external information including notes and  imaging from  primary care provider As well as notes that were available from care everywhere and other healthcare systems.  Past medical history, social, surgical and family history all reviewed in electronic medical record.  [name] pertanent information unless stated regarding to the chief complaint.   Review of Systems:  [name] headache, visual changes, nausea, vomiting, diarrhea, constipation, dizziness, abdominal pain, skin rash, fevers, chills, night sweats, weight loss, swollen lymph nodes, body aches, joint swelling, chest pain, shortness of breath, mood changes. POSITIVE muscle aches  Objective  Blood pressure 116/80, pulse 85, height 5\' 8"  (1.727 m), weight 182 lb (82.6 kg), SpO2 97 %.   General: [name] apparent distress alert and oriented x3 mood and affect normal, dressed appropriately.  HEENT: Pupils equal, extraocular movements intact  Respiratory: Patient's speak in full sentences and does not appear short of breath  Cardiovascular: [name] lower extremity edema, non tender, [name] erythema  Neuro: Cranial nerves II through XII are intact, neurovascularly intact in all extremities with 2+ DTRs and 2+ pulses.  Gait normal with good balance and coordination.  MSK:  Non tender with full range of motion and good stability and symmetric strength and tone of shoulders, elbows, wrist, hip, knee and ankles bilaterally.  Bilateral foot exam shows the patient does have breakdown the transverse arch still noted.  Patient does have more of a tingling sensation on exam is unsure neuropathy of the foot.  Negative squeeze test noted today.  Some very mild tightness of the posterior cord of the ankle noted.    Impression and Recommendations:     The above documentation has been reviewed and is accurate and complete Debra Pulley, DO       Note: This dictation was prepared with Dragon dictation along with smaller phrase technology. Any transcriptional errors that result from this process are unintentional.

## 2019-11-18 NOTE — Assessment & Plan Note (Addendum)
Continued difficulty.  Mild improvmenet with injection over a year ago Duexis, Relace shoes NCS/EMG ordered today at this point to rule out any lumbar radiculopathy or peripheral neuropathy that could be contributing.  Laboratory work-up has been fairly unremarkable.  If all imaging and nerve conduction study is normal we will send patient to formal physical therapy for the transverse arch including custom orthotics.  Total time with patient today 33 minutes which included my time with patient discussing the different treatment options, as well as reviewing patient's chart on the date of exam

## 2019-11-18 NOTE — Patient Instructions (Signed)
Duexis 3x a day for 6 days Not lace last eye on shoes Nerve conduction study bilateral Will talk about follow up after results

## 2019-11-25 ENCOUNTER — Other Ambulatory Visit: Payer: Self-pay

## 2019-11-25 DIAGNOSIS — R202 Paresthesia of skin: Secondary | ICD-10-CM

## 2019-12-17 ENCOUNTER — Encounter: Payer: 59 | Admitting: Neurology

## 2020-04-21 ENCOUNTER — Ambulatory Visit: Payer: 59 | Attending: Internal Medicine

## 2020-04-21 DIAGNOSIS — Z23 Encounter for immunization: Secondary | ICD-10-CM

## 2020-04-21 NOTE — Progress Notes (Signed)
   Covid-19 Vaccination Clinic  Name:  Debra [name]    MRN: [id number] DOB: May 05, 1955  04/21/2020  Debra [name] was observed post Covid-19 immunization for 15 minutes without incident. She was provided with Vaccine Information Sheet and instruction to access the V-Safe system.   Debra [name] was instructed to call 911 with any severe reactions post vaccine: Marland Kitchen Difficulty breathing  . Swelling of face and throat  . A fast heartbeat  . A bad rash all over body  . Dizziness and weakness   Immunizations Administered    Name Date Dose VIS Date Route   Moderna Covid-19 Booster Vaccine 04/21/2020  1:14 PM 0.25 mL 01/15/2020 Intramuscular   Manufacturer: Moderna   Lot: [id number]   [place]: [id number]

## 2020-05-12 DIAGNOSIS — H40053 Ocular hypertension, bilateral: Secondary | ICD-10-CM | POA: Diagnosis not present

## 2020-05-12 DIAGNOSIS — H524 Presbyopia: Secondary | ICD-10-CM | POA: Diagnosis not present

## 2020-05-12 DIAGNOSIS — H2513 Age-related nuclear cataract, bilateral: Secondary | ICD-10-CM | POA: Diagnosis not present

## 2020-05-12 DIAGNOSIS — H40013 Open angle with borderline findings, low risk, bilateral: Secondary | ICD-10-CM | POA: Diagnosis not present

## 2020-05-12 DIAGNOSIS — H5213 Myopia, bilateral: Secondary | ICD-10-CM | POA: Diagnosis not present

## 2020-05-12 DIAGNOSIS — H40009 Preglaucoma, unspecified, unspecified eye: Secondary | ICD-10-CM | POA: Diagnosis not present

## 2020-05-12 DIAGNOSIS — H43813 Vitreous degeneration, bilateral: Secondary | ICD-10-CM | POA: Diagnosis not present

## 2020-05-12 DIAGNOSIS — H43393 Other vitreous opacities, bilateral: Secondary | ICD-10-CM | POA: Diagnosis not present

## 2020-05-12 DIAGNOSIS — H1045 Other chronic allergic conjunctivitis: Secondary | ICD-10-CM | POA: Diagnosis not present

## 2020-05-12 DIAGNOSIS — H52223 Regular astigmatism, bilateral: Secondary | ICD-10-CM | POA: Diagnosis not present

## 2020-06-18 DIAGNOSIS — Z0000 Encounter for general adult medical examination without abnormal findings: Secondary | ICD-10-CM | POA: Diagnosis not present

## 2020-06-18 DIAGNOSIS — E2839 Other primary ovarian failure: Secondary | ICD-10-CM | POA: Diagnosis not present

## 2020-06-18 DIAGNOSIS — Z23 Encounter for immunization: Secondary | ICD-10-CM | POA: Diagnosis not present

## 2020-06-18 DIAGNOSIS — Z9884 Bariatric surgery status: Secondary | ICD-10-CM | POA: Diagnosis not present

## 2020-06-18 DIAGNOSIS — R7303 Prediabetes: Secondary | ICD-10-CM | POA: Diagnosis not present

## 2020-06-18 DIAGNOSIS — R69 Illness, unspecified: Secondary | ICD-10-CM | POA: Diagnosis not present

## 2020-06-18 DIAGNOSIS — E041 Nontoxic single thyroid nodule: Secondary | ICD-10-CM | POA: Diagnosis not present

## 2020-06-18 DIAGNOSIS — G629 Polyneuropathy, unspecified: Secondary | ICD-10-CM | POA: Diagnosis not present

## 2020-07-27 DIAGNOSIS — Z780 Asymptomatic menopausal state: Secondary | ICD-10-CM | POA: Diagnosis not present

## 2020-08-03 DIAGNOSIS — L2481 Irritant contact dermatitis due to metals: Secondary | ICD-10-CM | POA: Diagnosis not present

## 2020-11-10 DIAGNOSIS — S0512XA Contusion of eyeball and orbital tissues, left eye, initial encounter: Secondary | ICD-10-CM | POA: Diagnosis not present

## 2020-11-10 DIAGNOSIS — W19XXXA Unspecified fall, initial encounter: Secondary | ICD-10-CM | POA: Diagnosis not present

## 2020-11-10 DIAGNOSIS — S5001XA Contusion of right elbow, initial encounter: Secondary | ICD-10-CM | POA: Diagnosis not present

## 2020-11-10 DIAGNOSIS — S0003XA Contusion of scalp, initial encounter: Secondary | ICD-10-CM | POA: Diagnosis not present

## 2020-11-11 DIAGNOSIS — H524 Presbyopia: Secondary | ICD-10-CM | POA: Diagnosis not present

## 2020-11-11 DIAGNOSIS — R059 Cough, unspecified: Secondary | ICD-10-CM | POA: Diagnosis not present

## 2020-11-11 DIAGNOSIS — H52223 Regular astigmatism, bilateral: Secondary | ICD-10-CM | POA: Diagnosis not present

## 2020-11-11 DIAGNOSIS — J40 Bronchitis, not specified as acute or chronic: Secondary | ICD-10-CM | POA: Diagnosis not present

## 2020-11-11 DIAGNOSIS — S0512XA Contusion of eyeball and orbital tissues, left eye, initial encounter: Secondary | ICD-10-CM | POA: Diagnosis not present

## 2020-11-11 DIAGNOSIS — H5213 Myopia, bilateral: Secondary | ICD-10-CM | POA: Diagnosis not present

## 2020-11-11 DIAGNOSIS — H40053 Ocular hypertension, bilateral: Secondary | ICD-10-CM | POA: Diagnosis not present

## 2020-12-21 DIAGNOSIS — R053 Chronic cough: Secondary | ICD-10-CM | POA: Diagnosis not present

## 2020-12-23 DIAGNOSIS — R9389 Abnormal findings on diagnostic imaging of other specified body structures: Secondary | ICD-10-CM | POA: Diagnosis not present

## 2021-01-01 ENCOUNTER — Other Ambulatory Visit: Payer: Self-pay | Admitting: Family Medicine

## 2021-01-01 DIAGNOSIS — R9389 Abnormal findings on diagnostic imaging of other specified body structures: Secondary | ICD-10-CM

## 2021-01-14 DIAGNOSIS — Z1231 Encounter for screening mammogram for malignant neoplasm of breast: Secondary | ICD-10-CM | POA: Diagnosis not present

## 2021-01-18 ENCOUNTER — Ambulatory Visit
Admission: RE | Admit: 2021-01-18 | Discharge: 2021-01-18 | Disposition: A | Discharge status: Home / Self Care | Payer: Medicare HMO | Source: Ambulatory Visit | Attending: Family Medicine | Admitting: Family Medicine

## 2021-01-18 DIAGNOSIS — K760 Fatty (change of) liver, not elsewhere classified: Secondary | ICD-10-CM | POA: Diagnosis not present

## 2021-01-18 DIAGNOSIS — R918 Other nonspecific abnormal finding of lung field: Secondary | ICD-10-CM | POA: Diagnosis not present

## 2021-01-18 DIAGNOSIS — R9389 Abnormal findings on diagnostic imaging of other specified body structures: Secondary | ICD-10-CM

## 2021-01-18 DIAGNOSIS — R590 Localized enlarged lymph nodes: Secondary | ICD-10-CM | POA: Diagnosis not present

## 2021-01-18 DIAGNOSIS — J984 Other disorders of lung: Secondary | ICD-10-CM | POA: Diagnosis not present

## 2021-01-18 MED ORDER — IOPAMIDOL (ISOVUE-300) INJECTION 61%
75.0000 mL | Freq: Once | INTRAVENOUS | Status: AC | PRN
  Administered 2021-01-18: 75 mL via INTRAVENOUS

## 2021-01-24 DIAGNOSIS — M25532 Pain in left wrist: Secondary | ICD-10-CM | POA: Diagnosis not present

## 2021-01-24 DIAGNOSIS — M25511 Pain in right shoulder: Secondary | ICD-10-CM | POA: Diagnosis not present

## 2021-01-24 DIAGNOSIS — S50812A Abrasion of left forearm, initial encounter: Secondary | ICD-10-CM | POA: Diagnosis not present

## 2021-01-26 ENCOUNTER — Other Ambulatory Visit: Payer: Self-pay | Admitting: Family Medicine

## 2021-01-26 DIAGNOSIS — E041 Nontoxic single thyroid nodule: Secondary | ICD-10-CM

## 2021-01-29 ENCOUNTER — Ambulatory Visit
Admission: RE | Admit: 2021-01-29 | Discharge: 2021-01-29 | Disposition: A | Discharge status: Home / Self Care | Payer: Medicare HMO | Source: Ambulatory Visit | Attending: Family Medicine | Admitting: Family Medicine

## 2021-01-29 DIAGNOSIS — E041 Nontoxic single thyroid nodule: Secondary | ICD-10-CM | POA: Diagnosis not present

## 2021-05-21 ENCOUNTER — Other Ambulatory Visit: Payer: Self-pay

## 2021-05-21 ENCOUNTER — Ambulatory Visit (HOSPITAL_COMMUNITY)
Admission: EM | Admit: 2021-05-21 | Discharge: 2021-05-21 | Disposition: A | Discharge status: Home / Self Care | Payer: Medicare HMO | Attending: Physician Assistant | Admitting: Physician Assistant

## 2021-05-21 ENCOUNTER — Encounter (HOSPITAL_COMMUNITY): Payer: Self-pay

## 2021-05-21 DIAGNOSIS — S61412A Laceration without foreign body of left hand, initial encounter: Secondary | ICD-10-CM

## 2021-05-21 DIAGNOSIS — Z23 Encounter for immunization: Secondary | ICD-10-CM | POA: Diagnosis not present

## 2021-05-21 MED ORDER — LIDOCAINE HCL (PF) 2 % IJ SOLN
INTRAMUSCULAR | Status: AC
  Filled 2021-05-21: qty 5

## 2021-05-21 MED ORDER — TETANUS-DIPHTH-ACELL PERTUSSIS 5-2.5-18.5 LF-MCG/0.5 IM SUSY
PREFILLED_SYRINGE | INTRAMUSCULAR | Status: AC
  Filled 2021-05-21: qty 0.5

## 2021-05-21 MED ORDER — TETANUS-DIPHTH-ACELL PERTUSSIS 5-2.5-18.5 LF-MCG/0.5 IM SUSY
0.5000 mL | PREFILLED_SYRINGE | Freq: Once | INTRAMUSCULAR | Status: AC
  Administered 2021-05-21: 0.5 mL via INTRAMUSCULAR

## 2021-05-21 NOTE — ED Triage Notes (Signed)
Pt reports cutting her hand last night.   States her hand is tender .

## 2021-05-21 NOTE — ED Provider Notes (Signed)
[place]    CSN: [id number] Arrival date & time: 05/21/21  8280      History   Chief Complaint Chief Complaint  Patient presents with   Laceration    HPI Debra [name] is a 66 y.o. female.   Patient presents today with a 16-hour history of wound to her left hand.  Reports that she was attempting to prepare dinner when she cut her self accidentally at the base of her left index finger with a kitchen knife.  She cleaned this with soap and water and was able to achieve hemostasis with direct pressure.  She reports this area has become tender and has been oozing a little bit of blood prompting evaluation.  She is unsure when her last tetanus was.  She is right-handed.  Denies any numbness or difficulty moving her finger.   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 Lise Auer, [facility]   PANNICULECTOMY  2008   after gastric bypass   PLACEMENT OF BREAST IMPLANTS  2008    OB History   No obstetric history on file.      Home Medications    Prior to Admission medications   Medication Sig Start Date End Date Taking? Authorizing Provider  acetaminophen (TYLENOL) 500 MG tablet Take 2 tablets (1,000 mg total) by mouth 3 (three) times daily. 11/28/15   Stark Klein, MD  Cholecalciferol (VITAMIN D) 2000 units CAPS Take by mouth.    [provider]  gabapentin (NEURONTIN) 100 MG capsule Take 2 capsules  (200 mg total) by mouth at bedtime. 05/15/18   Lyndal Pulley, DO  meloxicam (MOBIC) 15 MG tablet Take 1 tablet (15 mg total) by mouth daily. 05/17/17   Lyndal Pulley, DO  Multiple Vitamin (MULTIVITAMIN) tablet Take 1 tablet by mouth daily.    [provider]  Omega-3 Fatty Acids (FISH OIL) 500 MG CAPS Take by mouth.    [provider]  predniSONE (DELTASONE) 50 MG tablet Take 1 tablet (50 mg total) by mouth daily. 06/12/17   Lyndal Pulley, DO  thiamine (VITAMIN B-1) 50 MG tablet Take 50 mg by mouth daily.    [provider]  Vitamin D, Ergocalciferol, (DRISDOL) 50000 units CAPS capsule Take 1 capsule (50,000 Units total) by mouth every 7 (seven) days. 04/25/17   Lyndal Pulley, DO    Family History History reviewed. No pertinent family history.  Social History Social History   Tobacco Use   Smoking status: Never   Smokeless tobacco: Never  Substance Use Topics   Alcohol use: No   Drug use: No     Allergies   Patient has no known allergies.   Review of Systems Review of Systems  Constitutional:  Positive for activity change. Negative for appetite change, fatigue and fever.  Skin:  Positive for wound.  Neurological:  Negative for weakness and  numbness.    Physical Exam Triage Vital Signs ED Triage Vitals  Enc Vitals Group     BP 05/21/21 1045 120/67     Pulse Rate 05/21/21 1045 60     Resp 05/21/21 1045 17     Temp 05/21/21 1045 98.3 F (36.8 C)     Temp Source 05/21/21 1045 Oral     SpO2 05/21/21 1045 100 %     Weight --      Height --      Head Circumference --      Peak Flow --      Pain Score 05/21/21 1043 1     Pain Loc --      Pain Edu? --      Excl. in [place]? --    No data found.  Updated Vital Signs BP 120/67 (BP Location: Right Arm)    Pulse 60    Temp 98.3 F (36.8 C) (Oral)    Resp 17    SpO2 100%   Visual Acuity Right Eye Distance:   Left Eye Distance:   Bilateral Distance:    Right Eye Near:   Left Eye Near:     Bilateral Near:     Physical Exam Vitals reviewed.  Constitutional:      General: She is awake. She is not in acute distress.    Appearance: Normal appearance. She is well-developed. She is not ill-appearing.     Comments: Very pleasant female appears stated age in no acute distress sitting comfortably in exam room  HENT:     Head: Normocephalic and atraumatic.  Cardiovascular:     Rate and Rhythm: Normal rate and regular rhythm.     Heart sounds: Normal heart sounds, S1 normal and S2 normal. No murmur heard.    Comments: Capillary refill within 2 seconds left pointer finger Pulmonary:     Effort: Pulmonary effort is normal.     Breath sounds: Normal breath sounds. No wheezing, rhonchi or rales.  Skin:    Findings: Laceration present.          Comments: 5 cm laceration noted at base of left index finger.  No bleeding or drainage noted.  No streaking or evidence of lymphangitis.  Psychiatric:        Behavior: Behavior is cooperative.     UC Treatments / Results  Labs (all labs ordered are listed, but only abnormal results are displayed) Labs Reviewed - No data to display  EKG   Radiology No results found.  Procedures Laceration Repair  Date/Time: 05/21/2021 11:35 AM Performed by: Terrilee Croak, PA-C Authorized by: Terrilee Croak, PA-C   Consent:    Consent obtained:  Verbal   Consent given by:  Patient   Risks, benefits, and alternatives were discussed: yes     Risks discussed:  Infection, pain, tendon damage, poor cosmetic result and poor wound healing   Alternatives discussed:  Referral and observation Universal protocol:    Procedure explained and questions answered to patient or proxy's satisfaction: yes     Patient identity confirmed:  Verbally with patient Anesthesia:    Anesthesia method:  Local infiltration   Local anesthetic:  Lidocaine 1% w/o epi Laceration details:    Location:  Hand   Hand location:  L palm   Length (cm):  5   Depth (mm):   4 Pre-procedure details:    Preparation:  Patient was prepped and draped in usual sterile fashion Exploration:    Limited defect created (  wound extended): yes     Hemostasis achieved with:  Direct pressure   Wound exploration: entire depth of wound visualized     Contaminated: no   Treatment:    Area cleansed with:  Chlorhexidine   Amount of cleaning:  Standard   Irrigation solution:  Sterile saline   Irrigation volume:  7mL   Irrigation method:  Syringe   Visualized foreign bodies/material removed: no     Scar revision: no   Skin repair:    Repair method:  Sutures   Suture size:  5-0   Suture material:  Prolene   Suture technique:  Simple interrupted   Number of sutures:  5 Approximation:    Approximation:  Close Repair type:    Repair type:  Simple Post-procedure details:    Dressing:  Antibiotic ointment and non-adherent dressing   Procedure completion:  Tolerated well, no immediate complications (including critical care time)  Medications Ordered in UC Medications  Tdap (BOOSTRIX) injection 0.5 mL (has no administration in time range)    Initial Impression / Assessment and Plan / UC Course  I have reviewed the triage vital signs and the nursing notes.  Pertinent labs & imaging results that were available during my care of the patient were reviewed by me and considered in my medical decision making (see chart for details).     Laceration closed with simple interrupted sutures.  See procedure note above.  Patient was encouraged to keep area clean.  She is unsure when her last tetanus was so this was updated during clinic visit today.  Discussed signs/symptoms of infection that would warrant reevaluation.  Strict return precautions given to which she expressed understanding.  Assuming appropriate healing she is to return in 10 days for suture removal.  Final Clinical Impressions(s) / UC Diagnoses   Final diagnoses:  Laceration of left hand without foreign body,  initial encounter     Discharge Instructions      Keep area clean.  Avoid strenuous use and repeated flexion of your finger/left hand.  Your tetanus was updated.  If everything heals appropriately please return in 10 days for suture removal.  If you develop any signs of infection including swelling, pain, drainage you need to be seen immediately to consider antibiotics.     ED Prescriptions   None    PDMP not reviewed this encounter.   Terrilee Croak, PA-C 05/21/21 1136

## 2021-05-21 NOTE — Discharge Instructions (Signed)
Keep area clean.  Avoid strenuous use and repeated flexion of your finger/left hand.  Your tetanus was updated.  If everything heals appropriately please return in 10 days for suture removal.  If you develop any signs of infection including swelling, pain, drainage you need to be seen immediately to consider antibiotics.

## 2021-06-08 DIAGNOSIS — H2513 Age-related nuclear cataract, bilateral: Secondary | ICD-10-CM | POA: Diagnosis not present

## 2021-06-08 DIAGNOSIS — H524 Presbyopia: Secondary | ICD-10-CM | POA: Diagnosis not present

## 2021-06-25 DIAGNOSIS — R69 Illness, unspecified: Secondary | ICD-10-CM | POA: Diagnosis not present

## 2021-06-25 DIAGNOSIS — L989 Disorder of the skin and subcutaneous tissue, unspecified: Secondary | ICD-10-CM | POA: Diagnosis not present

## 2021-06-25 DIAGNOSIS — E041 Nontoxic single thyroid nodule: Secondary | ICD-10-CM | POA: Diagnosis not present

## 2021-06-25 DIAGNOSIS — G629 Polyneuropathy, unspecified: Secondary | ICD-10-CM | POA: Diagnosis not present

## 2021-06-25 DIAGNOSIS — R7303 Prediabetes: Secondary | ICD-10-CM | POA: Diagnosis not present

## 2021-06-25 DIAGNOSIS — Z23 Encounter for immunization: Secondary | ICD-10-CM | POA: Diagnosis not present

## 2021-06-25 DIAGNOSIS — Z0000 Encounter for general adult medical examination without abnormal findings: Secondary | ICD-10-CM | POA: Diagnosis not present

## 2021-06-25 DIAGNOSIS — Z9884 Bariatric surgery status: Secondary | ICD-10-CM | POA: Diagnosis not present

## 2021-07-12 DIAGNOSIS — H6983 Other specified disorders of Eustachian tube, bilateral: Secondary | ICD-10-CM | POA: Diagnosis not present

## 2021-10-08 DIAGNOSIS — Z0184 Encounter for antibody response examination: Secondary | ICD-10-CM | POA: Diagnosis not present

## 2021-10-08 DIAGNOSIS — Z23 Encounter for immunization: Secondary | ICD-10-CM | POA: Diagnosis not present

## 2021-10-08 DIAGNOSIS — Z7184 Encounter for health counseling related to travel: Secondary | ICD-10-CM | POA: Diagnosis not present

## 2021-10-08 DIAGNOSIS — R7303 Prediabetes: Secondary | ICD-10-CM | POA: Diagnosis not present

## 2022-01-15 DIAGNOSIS — Z1231 Encounter for screening mammogram for malignant neoplasm of breast: Secondary | ICD-10-CM | POA: Diagnosis not present

## 2022-04-12 DIAGNOSIS — R69 Illness, unspecified: Secondary | ICD-10-CM | POA: Diagnosis not present

## 2022-04-12 DIAGNOSIS — R194 Change in bowel habit: Secondary | ICD-10-CM | POA: Diagnosis not present

## 2022-04-12 DIAGNOSIS — F5101 Primary insomnia: Secondary | ICD-10-CM | POA: Diagnosis not present

## 2022-06-15 DIAGNOSIS — H5213 Myopia, bilateral: Secondary | ICD-10-CM | POA: Diagnosis not present

## 2022-07-20 DIAGNOSIS — F5101 Primary insomnia: Secondary | ICD-10-CM | POA: Diagnosis not present

## 2022-07-20 DIAGNOSIS — Z87898 Personal history of other specified conditions: Secondary | ICD-10-CM | POA: Diagnosis not present

## 2022-07-20 DIAGNOSIS — E559 Vitamin D deficiency, unspecified: Secondary | ICD-10-CM | POA: Diagnosis not present

## 2022-07-20 DIAGNOSIS — Z9884 Bariatric surgery status: Secondary | ICD-10-CM | POA: Diagnosis not present

## 2022-07-20 DIAGNOSIS — G629 Polyneuropathy, unspecified: Secondary | ICD-10-CM | POA: Diagnosis not present

## 2022-07-20 DIAGNOSIS — F418 Other specified anxiety disorders: Secondary | ICD-10-CM | POA: Diagnosis not present

## 2022-07-20 DIAGNOSIS — M7701 Medial epicondylitis, right elbow: Secondary | ICD-10-CM | POA: Diagnosis not present

## 2022-07-20 DIAGNOSIS — Z124 Encounter for screening for malignant neoplasm of cervix: Secondary | ICD-10-CM | POA: Diagnosis not present

## 2022-07-20 DIAGNOSIS — Z0000 Encounter for general adult medical examination without abnormal findings: Secondary | ICD-10-CM | POA: Diagnosis not present

## 2022-07-20 DIAGNOSIS — E663 Overweight: Secondary | ICD-10-CM | POA: Diagnosis not present

## 2022-07-20 DIAGNOSIS — Z23 Encounter for immunization: Secondary | ICD-10-CM | POA: Diagnosis not present

## 2022-07-20 DIAGNOSIS — R7309 Other abnormal glucose: Secondary | ICD-10-CM | POA: Diagnosis not present

## 2022-09-26 DIAGNOSIS — Z23 Encounter for immunization: Secondary | ICD-10-CM | POA: Diagnosis not present

## 2022-10-12 NOTE — Progress Notes (Unsigned)
Debra [name] [address] [place] [address] Phone: [phone] Subjective:    I'Debra [name] seeing this patient by the request  of:  Debra Montana, MD  CC: Foot pain follow-up  [id number]  11/18/2019 Continued difficulty.  Mild improvmenet with injection over a year ago Duexis, Relace shoes NCS/EMG ordered today at this point to rule out any lumbar radiculopathy or peripheral neuropathy that could be contributing.  Laboratory work-up has been fairly unremarkable.  If all imaging and nerve conduction study is normal we will send patient to formal physical therapy for the transverse arch including custom orthotics.  Total time with patient today 33 minutes which included my time with patient discussing the different treatment options, as well as reviewing patient's chart on the date of exam  Updated 10/19/2022 Debra [name] is a 67 y.o. female coming in with complaint of foot pain. 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 Patient can only walk long distances in a hiking shoe for the support. When patient does yoga and extends her feet she will feel pain that goes up to her calves and has also notices she has some discomfort in the arches of her feet. Patient started gabapentin to help with the nerve issues but hasn't noticed it has helped. Patient also inquiring about the nerve study that was talked about in the past and if that is still something that is suggested for her to Debra [name].   Patient going to [place] in September and wants to make sure she is okay       Past Medical History:  Diagnosis Date   Depression (emotion)    Plantar fasciitis, bilateral    SUI (stress urinary incontinence, female)    Past Surgical History:  Procedure Laterality Date   LAPAROSCOPIC GASTRIC BYPASS  02/02/2005   Dr. Adele Dan, [facility]   PANNICULECTOMY  2008   after gastric bypass   PLACEMENT OF BREAST  IMPLANTS  2008   Social History   Socioeconomic History   Marital status: Single    Spouse name: Not on file   Number of children: Not on file   Years of education: Not on file   Highest education level: Not on file  Occupational History   Not on file  Tobacco Use   Smoking status: Never   Smokeless tobacco: Never  Substance and Sexual Activity   Alcohol use: No   Drug use: No   Sexual activity: Not on file  Other Topics Concern   Not on file  Social History Narrative   Not on file   Social Determinants of Health   Financial Resource Strain: Not on file  Food Insecurity: Not on file  Transportation Needs: Not on file  Physical Activity: Not on file  Stress: Not on file  Social Connections: Not on file   No Known Allergies No family history on file.  Current Outpatient Medications (Endocrine & Metabolic):    predniSONE (DELTASONE) 50 MG tablet, Take 1 tablet (50 mg total) by mouth daily. (Patient not taking: Reported on 10/19/2022)    Current Outpatient Medications (Analgesics):    acetaminophen (TYLENOL) 500 MG tablet, Take 2 tablets (1,000 mg total) by mouth 3 (three) times daily.   meloxicam (MOBIC) 7.5 MG tablet, Take 1 tablet (7.5 mg total) by mouth daily.   meloxicam (MOBIC) 15 MG tablet, Take 1 tablet (15 mg total) by mouth daily. (Patient not taking:  Reported on 10/19/2022)   Current Outpatient Medications (Other):    Cholecalciferol (VITAMIN D) 2000 units CAPS, Take by mouth.   gabapentin (NEURONTIN) 100 MG capsule, Take 2 capsules (200 mg total) by mouth at bedtime.   Multiple Vitamin (MULTIVITAMIN) tablet, Take 1 tablet by mouth daily.   Omega-3 Fatty Acids (FISH OIL) 500 MG CAPS, Take by mouth.   thiamine (VITAMIN B-1) 50 MG tablet, Take 50 mg by mouth daily.   Vitamin D, Ergocalciferol, (DRISDOL) 50000 units CAPS capsule, Take 1 capsule (50,000 Units total) by mouth every 7 (seven) days.   Reviewed prior external information including notes and  imaging from  primary care provider As well as notes that were available from care everywhere and other healthcare systems.  Past medical history, social, surgical and family history all reviewed in electronic medical record.  No pertanent information unless stated regarding to the chief complaint.   Review of Systems:  No headache, visual changes, nausea, vomiting, diarrhea, constipation, dizziness, abdominal pain, skin rash, fevers, chills, night sweats, weight loss, swollen lymph nodes, body aches, joint swelling, chest pain, shortness of breath, mood changes. POSITIVE muscle aches  Objective  Blood pressure 112/80, pulse 85, height 5\' 8"  (1.727 Debra [name]), weight 190 lb (86.2 kg), SpO2 98%.   General: No apparent distress alert and oriented x3 mood and affect normal, dressed appropriately.  HEENT: Pupils equal, extraocular movements intact  Respiratory: Patient's speak in full sentences and does not appear short of breath  Cardiovascular: No lower extremity edema, non tender, no erythema  Foot exam shows moderate breakdown of the transverse arch noted put splaying between the first and second toes bilaterally.  Patient does have bunion and bunionette formation noted as well.   Limited muscular skeletal ultrasound was performed and interpreted by Debra [name], Debra [name]   Limited ultrasound shows the patient does have narrowing of the midfoot right greater than left.  Patient does have hypoechoic changes of the first MTP also noted and some consistent with synovitis. 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  This included exercises focusing on stretching, strengthening, with significant focus on eccentric aspects.   Long term goals include an improvement in range of motion, strength, endurance as well as avoiding reinjury. Patient's frequency would include in 1-2 times a day, 3-5 times a  week for a duration of 6-12 weeks. Exercises for the foot include:  Stretches to help lengthen the lower leg and plantar fascia areas Theraband exercises for the lower leg and ankle to help strengthen the surrounding area- dorsiflexion, plantarflexion, inversion, eversion Massage rolling on the plantar surface of the foot with a frozen bottle, tennis ball or golf ball Towel or marble pick-ups to strengthen the plantar surface of the foot Weight bearing exercises to increase balance and overall stability   Proper technique shown and discussed handout in great detail with ATC.  All questions were discussed and answered.   Impression and Recommendations:    The above documentation has been reviewed and is accurate and complete Debra Saa, Debra [name]

## 2022-10-19 ENCOUNTER — Other Ambulatory Visit: Payer: Self-pay

## 2022-10-19 ENCOUNTER — Ambulatory Visit: Payer: Medicare HMO | Admitting: Family Medicine

## 2022-10-19 VITALS — BP 112/80 | HR 85 | Ht 68.0 in | Wt 190.0 lb

## 2022-10-19 DIAGNOSIS — M79672 Pain in left foot: Secondary | ICD-10-CM

## 2022-10-19 DIAGNOSIS — M79671 Pain in right foot: Secondary | ICD-10-CM

## 2022-10-19 DIAGNOSIS — M216X9 Other acquired deformities of unspecified foot: Secondary | ICD-10-CM

## 2022-10-19 MED ORDER — MELOXICAM 7.5 MG PO TABS: 7.5000 mg | ORAL_TABLET | Freq: Every day | ORAL | 0 refills | Status: DC

## 2022-10-19 NOTE — Patient Instructions (Addendum)
Good to see you  Exercises given Spenco orthotics Hoka recover sandals in house Do not be barefoot Meloxicam 7.5 ten then as needed Follow up in 10 weeks

## 2022-10-19 NOTE — Assessment & Plan Note (Signed)
Recurrent difficulty and now does have some midline arthritic changes that I think is also contributing in the midfoot.  We discussed with patient about proper shoes, over-the-counter orthotics and even the possibility of custom orthotics.  Patient would like to consider the custom use in the near future.  At the moment we will try supportive shoes, avoid being barefoot, topical anti-inflammatories.  Follow-up again in 6 to 8 weeks

## 2022-10-20 ENCOUNTER — Encounter: Payer: Self-pay | Admitting: Family Medicine

## 2022-11-10 DIAGNOSIS — L821 Other seborrheic keratosis: Secondary | ICD-10-CM | POA: Diagnosis not present

## 2022-11-10 DIAGNOSIS — L811 Chloasma: Secondary | ICD-10-CM | POA: Diagnosis not present

## 2022-12-07 NOTE — Progress Notes (Unsigned)
[name] [address] [place] [address] Phone: [phone] Subjective:    I'm seeing this patient by the request  of:  Laurann Montana, MD  CC: Bilateral foot pain  [id number]  10/19/2022 Recurrent difficulty and now does have some midline arthritic changes that I think is also contributing in the midfoot.  We discussed with patient about proper shoes, over-the-counter orthotics and even the possibility of custom orthotics.  Patient would like to consider the custom use in the near future.  At the moment we will try supportive shoes, avoid being barefoot, topical anti-inflammatories.  Follow-up again in 6 to 8 weeks   Update 12/08/2022 Debra [name] is a 67 y.o. female coming in with complaint of B foot pain. Patient states that she is no better      Past Medical History:  Diagnosis Date   Depression (emotion)    Plantar fasciitis, bilateral    SUI (stress urinary incontinence, female)    Past Surgical History:  Procedure Laterality Date   LAPAROSCOPIC GASTRIC BYPASS  02/02/2005   Dr. Adele Dan, [facility]   PANNICULECTOMY  2008   after gastric bypass   PLACEMENT OF BREAST IMPLANTS  2008   Social History   Socioeconomic History   Marital status: Single    Spouse name: Not on file   Number of children: Not on file   Years of education: Not on file   Highest education level: Not on file  Occupational History   Not on file  Tobacco Use   Smoking status: Never   Smokeless tobacco: Never  Substance and Sexual Activity   Alcohol use: No   Drug use: No   Sexual activity: Not on file  Other Topics Concern   Not on file  Social History Narrative   Not on file   Social Determinants of Health   Financial Resource Strain: Not on file  Food Insecurity: Not on file  Transportation Needs: Not on file  Physical Activity: Not on file  Stress: Not on file  Social Connections: Not on file   No Known Allergies No family history  on file.  Current Outpatient Medications (Endocrine & Metabolic):    predniSONE (DELTASONE) 50 MG tablet, Take 1 tablet (50 mg total) by mouth daily.   predniSONE (DELTASONE) 50 MG tablet, Take one tablet daily for the next 5 days.    Current Outpatient Medications (Analgesics):    acetaminophen (TYLENOL) 500 MG tablet, Take 2 tablets (1,000 mg total) by mouth 3 (three) times daily.   meloxicam (MOBIC) 15 MG tablet, Take 1 tablet (15 mg total) by mouth daily.   meloxicam (MOBIC) 7.5 MG tablet, Take 1 tablet (7.5 mg total) by mouth daily.   Current Outpatient Medications (Other):    Cholecalciferol (VITAMIN D) 2000 units CAPS, Take by mouth.   gabapentin (NEURONTIN) 100 MG capsule, Take 2 capsules (200 mg total) by mouth at bedtime.   Multiple Vitamin (MULTIVITAMIN) tablet, Take 1 tablet by mouth daily.   Omega-3 Fatty Acids (FISH OIL) 500 MG CAPS, Take by mouth.   thiamine (VITAMIN B-1) 50 MG tablet, Take 50 mg by mouth daily.   Vitamin D, Ergocalciferol, (DRISDOL) 50000 units CAPS capsule, Take 1 capsule (50,000 Units total) by mouth every 7 (seven) days.   Reviewed prior external information including notes and imaging from  primary care provider As well as notes that were available from care everywhere and other healthcare systems.  Past medical history, social, surgical and  family history all reviewed in electronic medical record.  No pertanent information unless stated regarding to the chief complaint.   Review of Systems:  No headache, visual changes, nausea, vomiting, diarrhea, constipation, dizziness, abdominal pain, skin rash, fevers, chills, night sweats, weight loss, swollen lymph nodes, body aches, joint swelling, chest pain, shortness of breath, mood changes. POSITIVE muscle aches  Objective  Blood pressure 120/84, height 5\' 8"  (1.727 m), weight 191 lb (86.6 kg).   General: No apparent distress alert and oriented x3 mood and affect normal, dressed appropriately.   HEENT: Pupils equal, extraocular movements intact  Respiratory: Patient's speak in full sentences and does not appear short of breath  Cardiovascular: No lower extremity edema, non tender, no erythema  Does have loss of movement in the first MTP  Patient does have some weakness noted in the left seems to be S1 innervation. Does have rigid midfoot arthritic changes.  After verbal consent patient was prepped with alcohol swab and with a 25-gauge half inch needle injected into the left first MTP.  A total of 0.5 cc of 0.5% Marcaine and 0.5 cc of Kenalog 40 mg/mL used.  No blood loss.  Band-Aid placed.  Postinjection instructions given     Impression and Recommendations:     The above documentation has been reviewed and is accurate and complete Judi Saa, DO

## 2022-12-08 ENCOUNTER — Encounter: Payer: Self-pay | Admitting: Family Medicine

## 2022-12-08 ENCOUNTER — Ambulatory Visit: Payer: Medicare HMO | Admitting: Family Medicine

## 2022-12-08 VITALS — BP 120/84 | Ht 68.0 in | Wt 191.0 lb

## 2022-12-08 DIAGNOSIS — M7752 Other enthesopathy of left foot: Secondary | ICD-10-CM | POA: Insufficient documentation

## 2022-12-08 DIAGNOSIS — M79672 Pain in left foot: Secondary | ICD-10-CM

## 2022-12-08 DIAGNOSIS — M79671 Pain in right foot: Secondary | ICD-10-CM | POA: Diagnosis not present

## 2022-12-08 DIAGNOSIS — M216X9 Other acquired deformities of unspecified foot: Secondary | ICD-10-CM

## 2022-12-08 MED ORDER — PREDNISONE 50 MG PO TABS: ORAL_TABLET | ORAL | 0 refills | Status: DC

## 2022-12-08 MED ORDER — PREDNISONE 20 MG PO TABS: ORAL_TABLET | ORAL | 0 refills | Status: DC

## 2022-12-08 NOTE — Assessment & Plan Note (Signed)
Patient given injection and tolerated the procedure well, discussed icing regimen and home exercises.  We discussed with patient about rigid soled shoes.  Continue to have trouble I do want to consider the possibility of a nerve conduction study.  Patient was to call us after her trip to [place].  Will have follow-up in the office within the next 2 months

## 2022-12-08 NOTE — Assessment & Plan Note (Signed)
Patient continues to have some discomfort and pain.  Did seem to have more of the first MTP joint effusion noted that is lacking some movement.  Discussed with patient at great length.  Patient has elected to try the injection of the first toe.  Hopefully this responds and patient does enjoy her trip.  Discussed wearing the rigid soled shoes more.  Follow-up with me again in 6 to 8 weeks

## 2022-12-08 NOTE — Patient Instructions (Addendum)
Write [place] when get back into town  Prednisone 20 mg daily for 7 days  Nerve conduction study  2-3 month follow up

## 2022-12-12 ENCOUNTER — Telehealth: Payer: Self-pay | Admitting: Family Medicine

## 2022-12-12 NOTE — Telephone Encounter (Signed)
Patient called with questions regarding the medication that was sent in. Dr Katrinka Blazing recommended Prednisone 20mg  for 7 days but the pharmacy has 50mg  x5 and 20mg  x5.  She picked up both of them  Please advise.

## 2022-12-23 IMAGING — US US THYROID
1 series · 13 of 25 positions shown · non-contrast
Comparison: 04/06/2016;

CLINICAL DATA: Prior ultrasound follow-up. Follow-up thyroid
nodules.

EXAM:
THYROID ULTRASOUND
TECHNIQUE: Ultrasound examination of the thyroid gland and adjacent soft
tissues was performed.

[Series 1: us thyroid · 0.08mm/px · 13 of 41 slices shown]
[im 1/41]
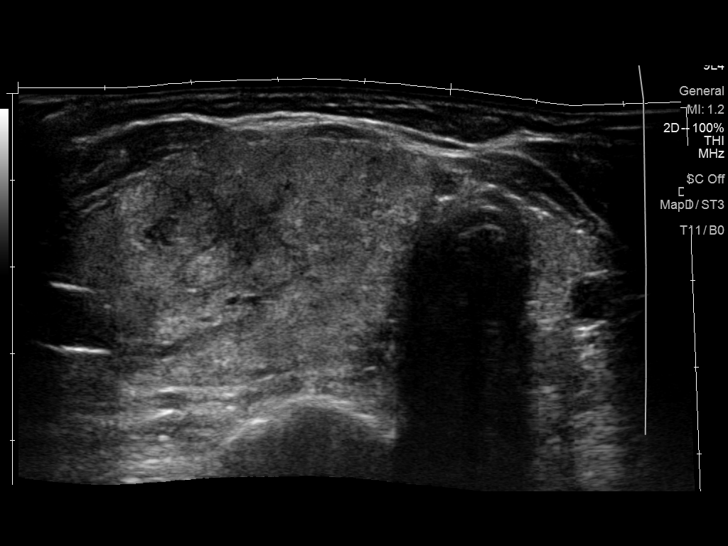
[im 4/41]
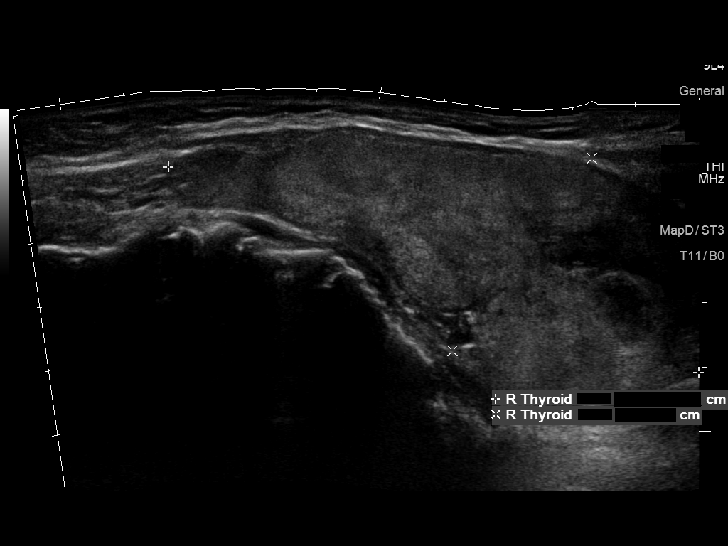
[im 7/41]
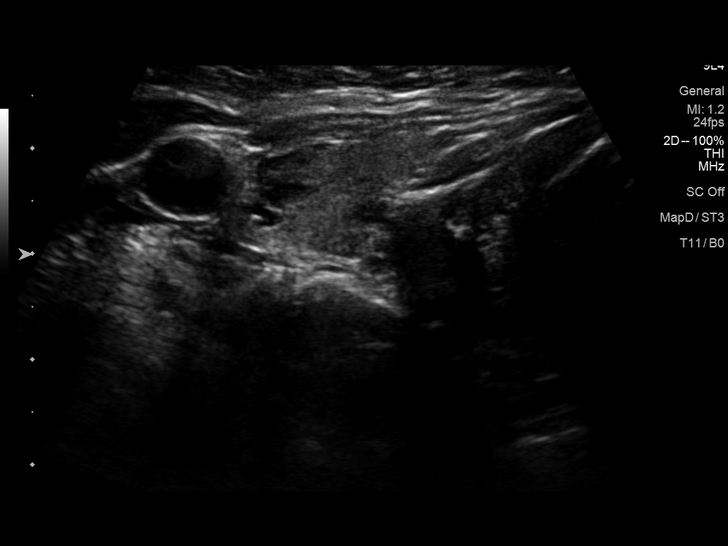
[im 11/41]
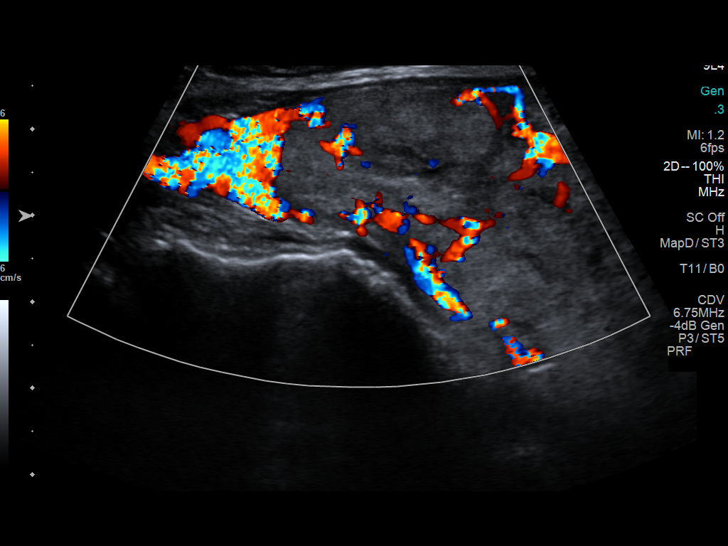
[im 14/41]
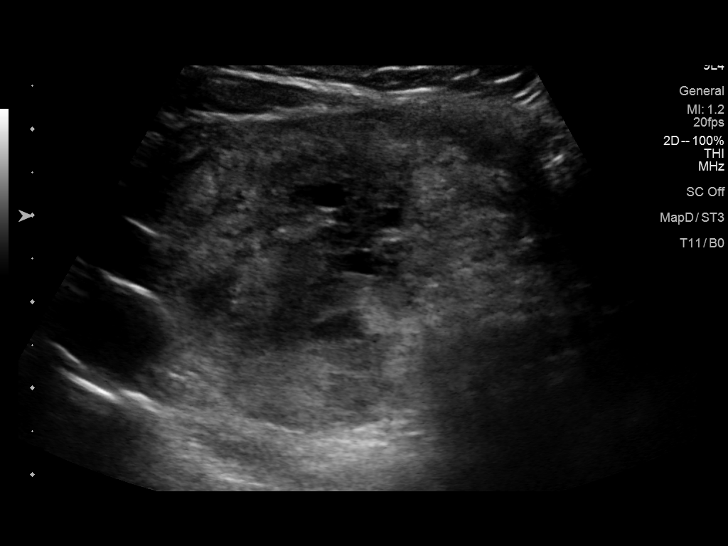
[im 17/41]
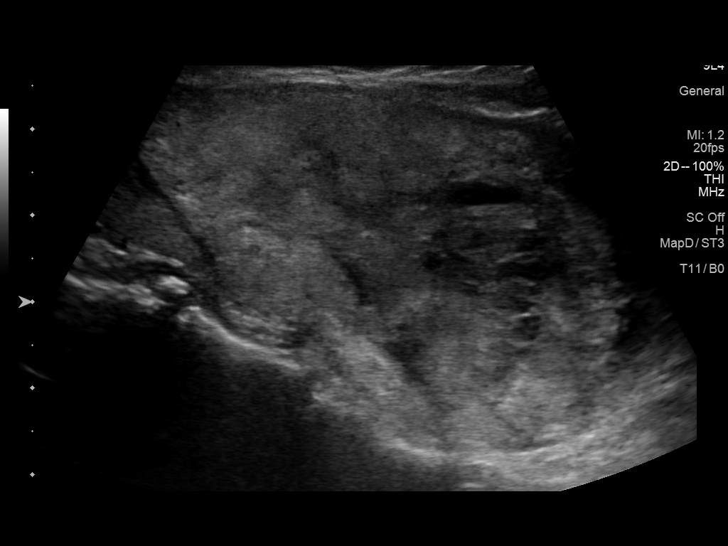
[im 21/41]
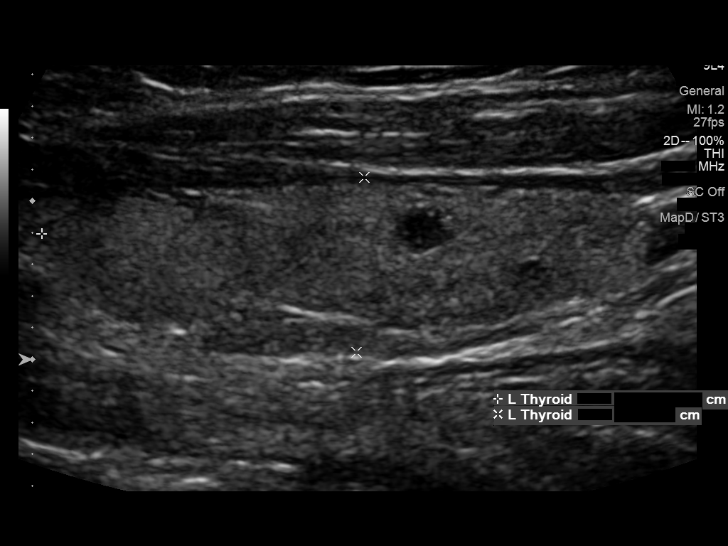
[im 24/41]
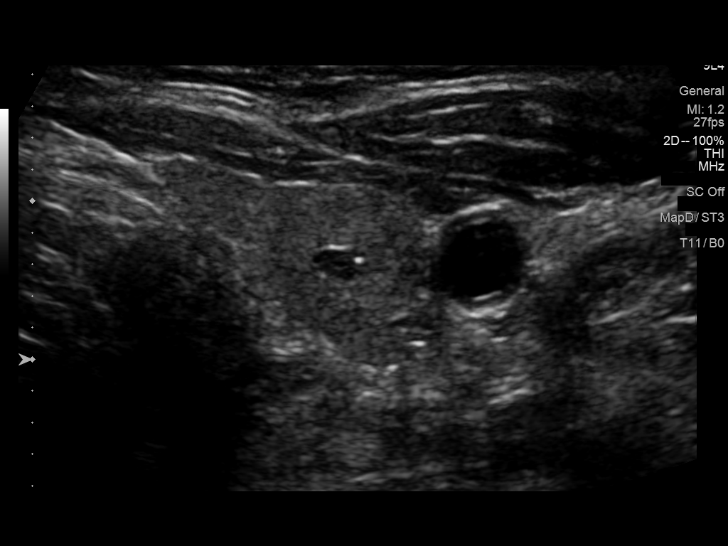
[im 27/41]
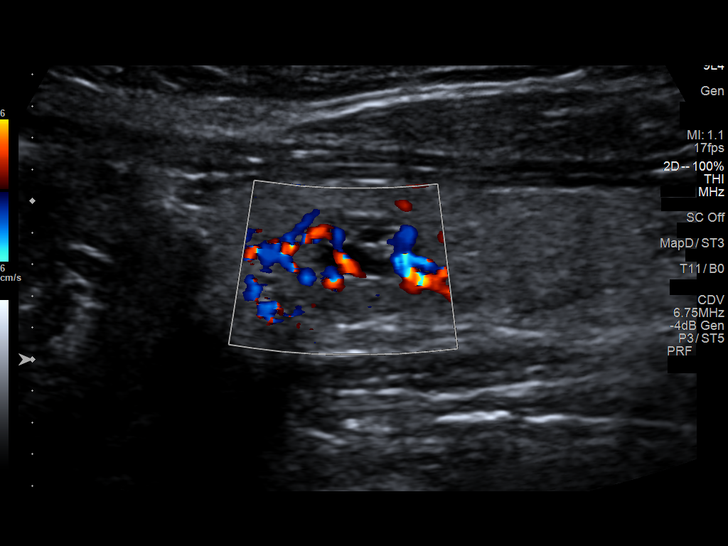
[im 31/41]
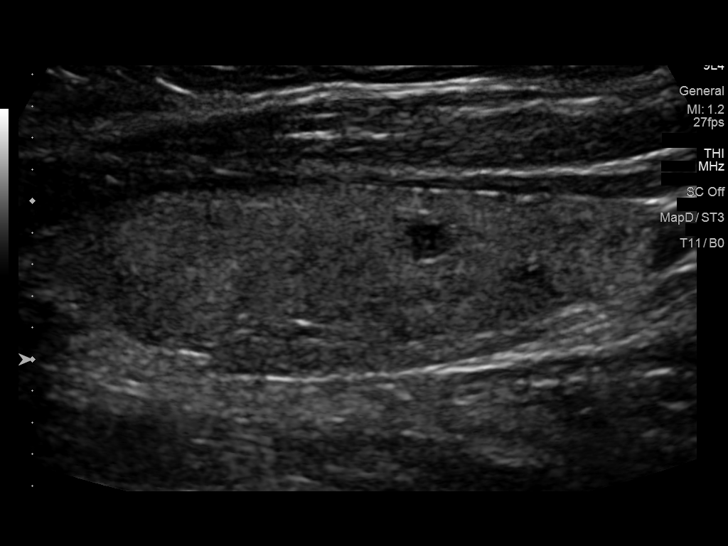
[im 34/41]
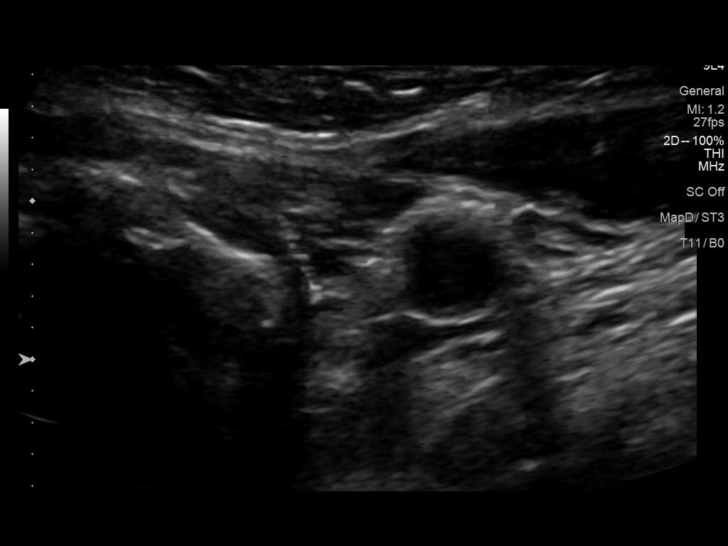
[im 37/41]
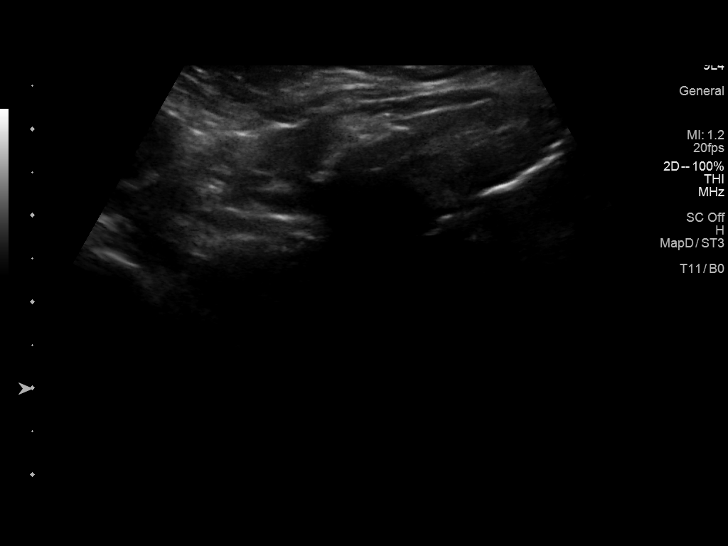
[im 41/41]
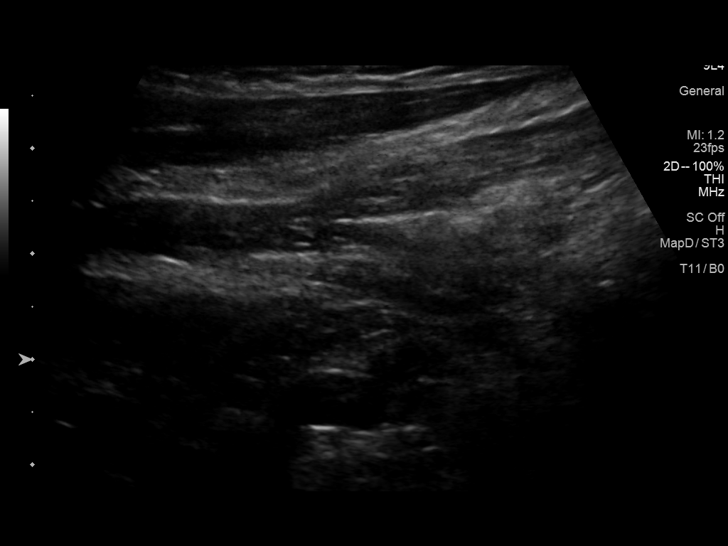

[13 of 25 positions shown; findings below may reference images not displayed]

ultrasound-guided right thyroid nodule
fine-needle aspiration performed 04/20/2016
FINDINGS: Parenchymal Echotexture: Moderately heterogenous

Isthmus: Normal in size measuring 0.3 cm in diameter, unchanged

Right lobe: Enlarged measuring 8.8 x 3.7 x 4.4 cm, previously, 9.1 x
3.5 x 4.3 cm

Left lobe: Normal in size measuring 4.1 x 1.1 x 1.2 cm, previously,
4.6 x 1.0 x 1.5 cm

_________________________________________________________

Estimated total number of nodules >/= 1 cm: 1

Number of spongiform nodules >/=  2 cm not described below (TR1): 0

Number of mixed cystic and solid nodules >/= 1.5 cm not described
below (TR2): 0

_________________________________________________________

The previously biopsied approximately 7.3 x 5.3 x 4.0 cm isoechoic
mass replacing near the entirety of the right lobe of the thyroid
(labeled 1) is grossly unchanged compared to the [DATE]
examination, previously, 6.8 x 5.0 x 3.7 cm, with slight size
differences likely attributable to scan plane projection and
accentuated due to the ill-defined borders of the nodule.
Correlation with previous biopsy results is advised.

_________________________________________________________

The approximately 0.6 cm minimally complex cyst within the mid
aspect the left lobe of the thyroid (labeled 2), is unchanged
compared to the [DATE] examination, previously, 0.5 cm, and again
does not meet criteria to recommend percutaneous sampling or
continued dedicated follow-up.
IMPRESSION: 1. Previously biopsied nodule/mass replacing the right lobe of the
thyroid is grossly unchanged compared to the 2138 examination.
Correlation with previous biopsy results is advised. Assuming a
benign pathologic diagnosis, repeat sampling and/or continued
dedicated follow-up is not recommended.
2. Solitary additional punctate (0.6 cm) minimally complex cyst
within the left lobe of the thyroid is unchanged compared to the
2138 examination and again does not meet criteria to recommend
percutaneous sampling or continued dedicated follow-up.

The above is in keeping with the ACR TI-RADS recommendations - [HOSPITAL] 5772;[DATE].

## 2022-12-30 DIAGNOSIS — H66003 Acute suppurative otitis media without spontaneous rupture of ear drum, bilateral: Secondary | ICD-10-CM | POA: Diagnosis not present

## 2023-01-18 DIAGNOSIS — Z1231 Encounter for screening mammogram for malignant neoplasm of breast: Secondary | ICD-10-CM | POA: Diagnosis not present

## 2023-01-20 ENCOUNTER — Other Ambulatory Visit: Payer: Self-pay

## 2023-01-20 DIAGNOSIS — R202 Paresthesia of skin: Secondary | ICD-10-CM

## 2023-02-02 ENCOUNTER — Ambulatory Visit: Payer: Medicare HMO | Admitting: Neurology

## 2023-02-02 DIAGNOSIS — R202 Paresthesia of skin: Secondary | ICD-10-CM | POA: Diagnosis not present

## 2023-02-02 NOTE — Procedures (Signed)
[facility]  [address] [place], [address]  [place], [place] [address] Tel: [phone] Fax: [phone] Test Date:  02/02/2023  Patient: Debra [name] DOB: [birth year] Physician: Nita Sickle, DO  Sex: Female Height: 5\' 8"  Ref Phys: Antoine Primas, DO  ID#: [id number]   Technician:    History: This is a 67 year old female referred for evaluation of bilateral feet pain and paresthesias.  NCV & EMG Findings: Electrodiagnostic testing of the right lower extremity and additional studies of the left shows: Bilateral sural and superficial peroneal sensory responses are within normal limits. Bilateral peroneal and tibial motor responses are within normal limits. Bilateral tibial H reflex studies are within normal limits. There is no evidence of active or chronic motor axonal changes affecting any of the tested muscles.  Motor unit configuration and recruitment pattern is within normal limits.  Impression: This is a normal study of the lower extremities.  In particular, there is no evidence of a large fiber sensorimotor polyneuropathy or lumbosacral radiculopathy.    ___________________________ Nita Sickle, DO    Nerve Conduction Studies   Stim Site NR Peak (ms) Norm Peak (ms) O-P Amp (V) Norm O-P Amp  Left Sup Peroneal Anti Sensory (Ant Lat Mall)  32 C  12 cm    2.7 <4.6 8.4 >3  Right Sup Peroneal Anti Sensory (Ant Lat Mall)  32 C  12 cm    2.2 <4.6 6.3 >3  Left Sural Anti Sensory (Lat Mall)  32 C  Calf    2.9 <4.6 7.7 >3  Right Sural Anti Sensory (Lat Mall)  32 C  Calf    2.4 <4.6 8.5 >3     Stim Site NR Onset (ms) Norm Onset (ms) O-P Amp (mV) Norm O-P Amp Site1 Site2 Delta-0 (ms) Dist (cm) Vel (m/s) Norm Vel (m/s)  Left Peroneal Motor (Ext Dig Brev)  32 C  Ankle    4.3 <6.0 5.0 >2.5 B Fib Ankle 8.0 36.0 45 >40  B Fib    12.3  5.0  Poplt B Fib 1.5 8.0 53 >40  Poplt    13.8  4.9         Right Peroneal Motor (Ext Dig Brev)  32 C  Ankle    4.6 <6.0 4.4 >2.5 B  Fib Ankle 7.4 35.0 47 >40  B Fib    12.0  4.1  Poplt B Fib 1.6 8.0 50 >40  Poplt    13.6  4.0         Left Tibial Motor (Abd Hall Brev)  32 C  Ankle    3.8 <6.0 6.3 >4 Knee Ankle 8.9 40.0 45 >40  Knee    12.7  4.6         Right Tibial Motor (Abd Hall Brev)  32 C  Ankle    4.0 <6.0 6.9 >4 Knee Ankle 9.4 40.0 43 >40  Knee    13.4  4.7          Electromyography   Side Muscle Ins.Act Fibs Fasc Recrt Amp Dur Poly Activation Comment  Right AntTibialis Nml Nml Nml Nml Nml Nml Nml Nml N/A  Right Gastroc Nml Nml Nml Nml Nml Nml Nml Nml N/A  Right Flex Dig Long Nml Nml Nml Nml Nml Nml Nml Nml N/A  Right RectFemoris Nml Nml Nml Nml Nml Nml Nml Nml N/A  Right BicepsFemS Nml Nml Nml Nml Nml Nml Nml Nml N/A  Left BicepsFemS Nml Nml Nml Nml Nml Nml Nml Nml N/A  Left AntTibialis Nml Nml Nml Nml Nml Nml Nml Nml N/A  Left Gastroc Nml Nml Nml Nml Nml Nml Nml Nml N/A  Left Flex Dig Long Nml Nml Nml Nml Nml Nml Nml Nml N/A  Left RectFemoris Nml Nml Nml Nml Nml Nml Nml Nml N/A      Waveforms:

## 2023-02-07 ENCOUNTER — Telehealth: Payer: Self-pay | Admitting: Family Medicine

## 2023-02-07 NOTE — Telephone Encounter (Signed)
Pt given normal NCV results and scheduled for f/u in January. She is comfortable waiting til January but does think her symptoms are worsening. She plans to journal this info but wonders if there is anything she should be doing in the interim to help.

## 2023-04-04 NOTE — Progress Notes (Signed)
 [name] [address] [place] [address] Phone: [phone] Subjective:   Debra [name], am serving as a scribe for Dr. Arthea [name].  I'm seeing this patient by the request  of:  Teresa Channel, MD  CC: left foot pain   YEP:Dlagzrupcz  12/08/2022 Patient given injection and tolerated the procedure well, discussed icing regimen and home exercises.  We discussed with patient about rigid soled shoes.  Continue to have trouble I do want to consider the possibility of a nerve conduction study.  Patient was to call us  after her trip to [place].  Will have follow-up in the office within the next 2 months     Update 04/06/2023 Debra [name] is a 68 y.o. female coming in with complaint of L foot pain. Patient states that both feet are not doing really well. Wants to know what she can do to help her still be able to do her yoga. Has had the nerve conduction study and is wanting to follow up since that.   Has been icing, pool shoes, recovery sandals.       Past Medical History:  Diagnosis Date   Depression (emotion)    Plantar fasciitis, bilateral    SUI (stress urinary incontinence, female)    Past Surgical History:  Procedure Laterality Date   LAPAROSCOPIC GASTRIC BYPASS  02/02/2005   Dr. Deedra Mechanic, [facility]   PANNICULECTOMY  2008   after gastric bypass   PLACEMENT OF BREAST IMPLANTS  2008   Social History   Socioeconomic History   Marital status: Single    Spouse name: Not on file   Number of children: Not on file   Years of education: Not on file   Highest education level: Not on file  Occupational History   Not on file  Tobacco Use   Smoking status: Never   Smokeless tobacco: Never  Substance and Sexual Activity   Alcohol use: No   Drug use: No   Sexual activity: Not on file  Other Topics Concern   Not on file  Social History Narrative   Not on file   Social Drivers of Health   Financial Resource Strain: Not on file   Food Insecurity: Not on file  Transportation Needs: Not on file  Physical Activity: Not on file  Stress: Not on file  Social Connections: Not on file   No Known Allergies No family history on file.       Current Outpatient Medications (Other):    Cholecalciferol (VITAMIN D ) 2000 units CAPS, Take by mouth.   Multiple Vitamin (MULTIVITAMIN) tablet, Take 1 tablet by mouth daily.   Omega-3 Fatty Acids (FISH OIL) 500 MG CAPS, Take by mouth.   thiamine  (VITAMIN B-1) 50 MG tablet, Take 50 mg by mouth daily.   Vitamin D , Ergocalciferol , (DRISDOL ) 50000 units CAPS capsule, Take 1 capsule (50,000 Units total) by mouth every 7 (seven) days.   Reviewed prior external information including notes and imaging from  primary care provider As well as notes that were available from care everywhere and other healthcare systems.  Past medical history, social, surgical and family history all reviewed in electronic medical record.  No pertanent information unless stated regarding to the chief complaint.   Review of Systems:  No headache, visual changes, nausea, vomiting, diarrhea, constipation, dizziness, abdominal pain, skin rash, fevers, chills, night sweats, weight loss, swollen lymph nodes, body aches, joint swelling, chest pain, shortness of breath, mood changes. POSITIVE muscle aches  Objective  Blood pressure 110/72, pulse 78, height 5' 8 (1.727 m), SpO2 98%.   General: No apparent distress alert and oriented x3 mood and affect normal, dressed appropriately.  HEENT: Pupils equal, extraocular movements intact  Respiratory: Patient's speak in full sentences and does not appear short of breath  Cardiovascular: No lower extremity edema, non tender, no erythema  Foot exam shows a patient does have a breakdown of the transverse arch bilaterally.  Does have some limitation in the dorsalis pedis pulse but does seem to be symmetric with a little less.  Relatively good posterior tibialis pulse  noted.    Impression and Recommendations:     The above documentation has been reviewed and is accurate and complete Debra [name] M Debra Victor, DO

## 2023-04-06 ENCOUNTER — Ambulatory Visit (INDEPENDENT_AMBULATORY_CARE_PROVIDER_SITE_OTHER): Payer: Medicare HMO | Admitting: Family Medicine

## 2023-04-06 VITALS — BP 110/72 | HR 78 | Ht 68.0 in

## 2023-04-06 DIAGNOSIS — M2550 Pain in unspecified joint: Secondary | ICD-10-CM

## 2023-04-06 DIAGNOSIS — M79671 Pain in right foot: Secondary | ICD-10-CM

## 2023-04-06 DIAGNOSIS — M79604 Pain in right leg: Secondary | ICD-10-CM | POA: Diagnosis not present

## 2023-04-06 DIAGNOSIS — M79672 Pain in left foot: Secondary | ICD-10-CM | POA: Diagnosis not present

## 2023-04-06 LAB — CBC WITH DIFFERENTIAL/PLATELET
Basophils Absolute: 0 10*3/uL (ref 0.0–0.1)
Basophils Relative: 0.4 % (ref 0.0–3.0)
Eosinophils Absolute: 0.1 10*3/uL (ref 0.0–0.7)
Eosinophils Relative: 1.8 % (ref 0.0–5.0)
HCT: 41.3 % (ref 36.0–46.0)
Hemoglobin: 13.5 g/dL (ref 12.0–15.0)
Lymphocytes Relative: 26.8 % (ref 12.0–46.0)
Lymphs Abs: 1.7 10*3/uL (ref 0.7–4.0)
MCHC: 32.7 g/dL (ref 30.0–36.0)
MCV: 91.8 fL (ref 78.0–100.0)
Monocytes Absolute: 0.5 10*3/uL (ref 0.1–1.0)
Monocytes Relative: 7.5 % (ref 3.0–12.0)
Neutro Abs: 3.9 10*3/uL (ref 1.4–7.7)
Neutrophils Relative %: 63.5 % (ref 43.0–77.0)
Platelets: 190 10*3/uL (ref 150.0–400.0)
RBC: 4.5 Mil/uL (ref 3.87–5.11)
RDW: 13.8 % (ref 11.5–15.5)
WBC: 6.2 10*3/uL (ref 4.0–10.5)

## 2023-04-06 LAB — COMPREHENSIVE METABOLIC PANEL
ALT: 12 U/L (ref 0–35)
AST: 18 U/L (ref 0–37)
Albumin: 4.3 g/dL (ref 3.5–5.2)
Alkaline Phosphatase: 65 U/L (ref 39–117)
BUN: 21 mg/dL (ref 6–23)
CO2: 26 meq/L (ref 19–32)
Calcium: 9.3 mg/dL (ref 8.4–10.5)
Chloride: 104 meq/L (ref 96–112)
Creatinine, Ser: 0.77 mg/dL (ref 0.40–1.20)
GFR: 79.44 mL/min (ref >60.00)
Glucose, Bld: 95 mg/dL (ref 70–99)
Potassium: 4.9 meq/L (ref 3.5–5.1)
Sodium: 138 meq/L (ref 135–145)
Total Bilirubin: 0.4 mg/dL (ref 0.2–1.2)
Total Protein: 6.8 g/dL (ref 6.0–8.3)

## 2023-04-06 LAB — T4, FREE: Free T4: 0.81 ng/dL (ref 0.60–1.60)

## 2023-04-06 LAB — T3, FREE: T3, Free: 3.2 pg/mL (ref 2.3–4.2)

## 2023-04-06 LAB — IBC PANEL
Iron: 82 ug/dL (ref 42–145)
Saturation Ratios: 17.5 % — ABNORMAL LOW (ref 20.0–50.0)
TIBC: 467.6 ug/dL — ABNORMAL HIGH (ref 250.0–450.0)
Transferrin: 334 mg/dL (ref 212.0–360.0)

## 2023-04-06 LAB — C-REACTIVE PROTEIN: CRP: <1 mg/dL (ref 0.5–20.0)

## 2023-04-06 LAB — SEDIMENTATION RATE: Sed Rate: 15 mm/h (ref 0–30)

## 2023-04-06 LAB — FERRITIN: Ferritin: 9.1 ng/mL — ABNORMAL LOW (ref 10.0–291.0)

## 2023-04-06 LAB — URIC ACID: Uric Acid, Serum: 4.6 mg/dL (ref 2.4–7.0)

## 2023-04-06 LAB — TSH: TSH: 2.3 u[IU]/mL (ref 0.35–5.50)

## 2023-04-06 NOTE — Patient Instructions (Addendum)
 Good to see you  ABI bilateral lower extremity Get lab work on the way out Keep wearing the good shoes look into ultra Stop pool shoes to see how it goes [facility]  ([facility] [employer] in [facility]) [address], [address] [place], [place] [address] [phone] Follow up in 3 months just in case

## 2023-04-07 ENCOUNTER — Encounter: Payer: Self-pay | Admitting: Family Medicine

## 2023-04-07 NOTE — Assessment & Plan Note (Addendum)
 Continue to have pain in the lower extremities and seems to be more bilateral.  Will get ABI to further evaluate for any type of vascular abnormality that could be contributing as well.  Discussed icing regimen and home exercises otherwise.  Discussed with patient to continue the good shoes at the moment.  Follow-up again in 6 to 8 weeks.  Total time with patient today as well as reviewing outside records laboratory work also ordered today.

## 2023-04-10 ENCOUNTER — Encounter: Payer: Self-pay | Admitting: Family Medicine

## 2023-04-12 LAB — RHEUMATOID FACTOR: Rheumatoid fact SerPl-aCnc: <10 [IU]/mL (ref <14)

## 2023-04-12 LAB — ANTI-NUCLEAR AB-TITER (ANA TITER)
ANA TITER: 1:80 {titer} — ABNORMAL HIGH
ANA Titer 1: 1:320 {titer} — ABNORMAL HIGH

## 2023-04-12 LAB — PTH, INTACT AND CALCIUM
Calcium: 9.4 mg/dL (ref 8.6–10.4)
PTH: 24 pg/mL (ref 16–77)

## 2023-04-12 LAB — ACETAMINOPHEN LEVEL: Acetaminophen (Tylenol), Serum: <10 mg/L — ABNORMAL LOW (ref 10–20)

## 2023-04-12 LAB — VITAMIN D 1,25 DIHYDROXY
Vitamin D 1, 25 (OH)2 Total: 44 pg/mL (ref 18–72)
Vitamin D2 1, 25 (OH)2: <8 pg/mL
Vitamin D3 1, 25 (OH)2: 44 pg/mL

## 2023-04-12 LAB — CYCLIC CITRUL PEPTIDE ANTIBODY, IGG: Cyclic Citrullin Peptide Ab: <16 U

## 2023-04-12 LAB — CALCIUM, IONIZED: Calcium, Ion: 5.1 mg/dL (ref 4.7–5.5)

## 2023-04-12 LAB — ANA: Anti Nuclear Antibody (ANA): POSITIVE — AB

## 2023-04-27 ENCOUNTER — Ambulatory Visit (HOSPITAL_COMMUNITY)
Admission: RE | Admit: 2023-04-27 | Discharge: 2023-04-27 | Disposition: A | Discharge status: Home / Self Care | Payer: Medicare HMO | Source: Ambulatory Visit | Attending: Family Medicine | Admitting: Family Medicine

## 2023-04-27 ENCOUNTER — Encounter: Payer: Self-pay | Admitting: Family Medicine

## 2023-04-27 DIAGNOSIS — M79671 Pain in right foot: Secondary | ICD-10-CM | POA: Insufficient documentation

## 2023-04-27 DIAGNOSIS — M79672 Pain in left foot: Secondary | ICD-10-CM | POA: Diagnosis not present

## 2023-04-28 LAB — VAS US LOWER EXT ART SEG MULTI (SEGMENTALS & LE RAYNAUDS)
Left ABI: 1.25
Right ABI: 1.28

## 2023-05-26 ENCOUNTER — Encounter: Payer: Self-pay | Admitting: Family Medicine

## 2023-05-26 DIAGNOSIS — M79671 Pain in right foot: Secondary | ICD-10-CM

## 2023-05-27 ENCOUNTER — Encounter: Payer: Self-pay | Admitting: Family Medicine

## 2023-06-09 DIAGNOSIS — L309 Dermatitis, unspecified: Secondary | ICD-10-CM | POA: Diagnosis not present

## 2023-06-22 DIAGNOSIS — H524 Presbyopia: Secondary | ICD-10-CM | POA: Diagnosis not present

## 2023-07-07 DIAGNOSIS — M79672 Pain in left foot: Secondary | ICD-10-CM | POA: Diagnosis not present

## 2023-07-07 DIAGNOSIS — M79671 Pain in right foot: Secondary | ICD-10-CM | POA: Diagnosis not present

## 2023-07-07 DIAGNOSIS — E669 Obesity, unspecified: Secondary | ICD-10-CM | POA: Diagnosis not present

## 2023-07-07 DIAGNOSIS — R768 Other specified abnormal immunological findings in serum: Secondary | ICD-10-CM | POA: Diagnosis not present

## 2023-07-07 DIAGNOSIS — Z6830 Body mass index (BMI) 30.0-30.9, adult: Secondary | ICD-10-CM | POA: Diagnosis not present

## 2023-07-07 DIAGNOSIS — R6889 Other general symptoms and signs: Secondary | ICD-10-CM | POA: Diagnosis not present

## 2023-07-19 NOTE — Progress Notes (Unsigned)
 [facility] [address] [place] [address] Phone: [phone] Subjective:   Debra [name] am a scribe for Dr. Felipe Horton.   I'm seeing this patient by the request  of:  Debra Grave, MD  CC: Pain mostly of the legs right greater than left  GNF:[id number]  04/06/2023 Continue to have pain in the lower extremities and seems to be more bilateral.  Will get ABI to further evaluate for any type of vascular abnormality that could be contributing as well.  Discussed icing regimen and home exercises otherwise.  Discussed with patient to continue the good shoes at the moment.  Follow-up again in 6 to 8 weeks.  Total time with patient today as well as reviewing outside records laboratory work also ordered today.      Update 07/20/2023 Debra [name] is a 68 y.o. female coming in with complaint of R leg pain. Patient states that right foot is progressively worse. Often getting a burning sensation on the top of the foot. Left foot is still doing the same as last time. Would like to discuss whether or not testing for diabetes has been done through labs or not already.     Patient has had significant workup including ABI that was normal, nerve conduction test that are normal   Patient has had an ultrasound in the past showing that there was arthritic changes of the first MTP and midfoot.  Laboratory workup did show a positive ANA with a relatively high titer.  Past Medical History:  Diagnosis Date   Depression (emotion)    Plantar fasciitis, bilateral    SUI (stress urinary incontinence, female)    Past Surgical History:  Procedure Laterality Date   LAPAROSCOPIC GASTRIC BYPASS  02/02/2005   Dr. Reatha Campanile, [facility]   PANNICULECTOMY  2008   after gastric bypass   PLACEMENT OF BREAST IMPLANTS  2008   Social History   Socioeconomic History   Marital status: Single    Spouse name: Not on file   Number of children: Not on file   Years of education:  Not on file   Highest education level: Not on file  Occupational History   Not on file  Tobacco Use   Smoking status: Never   Smokeless tobacco: Never  Substance and Sexual Activity   Alcohol use: No   Drug use: No   Sexual activity: Not on file  Other Topics Concern   Not on file  Social History Narrative   Not on file   Social Drivers of Health   Financial Resource Strain: Not on file  Food Insecurity: Not on file  Transportation Needs: Not on file  Physical Activity: Not on file  Stress: Not on file  Social Connections: Not on file   No Known Allergies No family history on file.       Current Outpatient Medications (Other):    Cholecalciferol (VITAMIN D ) 2000 units CAPS, Take by mouth.   DULoxetine  (CYMBALTA ) 20 MG capsule, Take 1 capsule (20 mg total) by mouth daily.   Multiple Vitamin (MULTIVITAMIN) tablet, Take 1 tablet by mouth daily.   Omega-3 Fatty Acids (FISH OIL) 500 MG CAPS, Take by mouth.   thiamine  (VITAMIN B-1) 50 MG tablet, Take 50 mg by mouth daily.   Vitamin D , Ergocalciferol , (DRISDOL ) 50000 units CAPS capsule, Take 1 capsule (50,000 Units total) by mouth every 7 (seven) days.   Reviewed prior external information including notes and imaging from  primary care provider  As well as notes that were available from care everywhere and other healthcare systems.  Past medical history, social, surgical and family history all reviewed in electronic medical record.  No pertanent information unless stated regarding to the chief complaint.   Review of Systems:  No headache, visual changes, nausea, vomiting, diarrhea, constipation, dizziness, abdominal pain, skin rash, fevers, chills, night sweats, weight loss, swollen lymph nodes, body aches, joint swelling, chest pain, shortness of breath, mood changes. POSITIVE muscle aches  Objective  Blood pressure 104/60, pulse 94, height 5\' 8"  (1.727 m), weight 202 lb (91.6 kg), SpO2 97%.   General: No apparent  distress alert and oriented x3 mood and affect normal, dressed appropriately.  HEENT: Pupils equal, extraocular movements intact  Respiratory: Patient's speak in full sentences and does not appear short of breath  Cardiovascular: No lower extremity edema, non tender, no erythema  Foot exam does show a rigid midfoot noted.  Patient is wearing poor shoes.  Neurovascularly intact distally. Rigid first MTP and patient does have splaying between the 1st and 2nd toes.   Impression and Recommendations:     The above documentation has been reviewed and is accurate and complete Debra [name] M Debra Weichel, DO

## 2023-07-20 ENCOUNTER — Encounter: Payer: Self-pay | Admitting: Family Medicine

## 2023-07-20 ENCOUNTER — Ambulatory Visit: Payer: Medicare HMO | Admitting: Family Medicine

## 2023-07-20 VITALS — BP 104/60 | HR 94 | Ht 68.0 in | Wt 202.0 lb

## 2023-07-20 DIAGNOSIS — M79672 Pain in left foot: Secondary | ICD-10-CM

## 2023-07-20 DIAGNOSIS — M216X9 Other acquired deformities of unspecified foot: Secondary | ICD-10-CM | POA: Diagnosis not present

## 2023-07-20 DIAGNOSIS — M79671 Pain in right foot: Secondary | ICD-10-CM

## 2023-07-20 MED ORDER — DULOXETINE HCL 20 MG PO CPEP: 20.0000 mg | ORAL_CAPSULE | Freq: Every day | ORAL | 3 refills | Status: DC

## 2023-07-20 NOTE — Patient Instructions (Addendum)
 Good to see you. Cymbalta  20mg  Have PCP vitamin d , Ferritin, and iron panel. Referral to Sports medicine for orthotics. Return in 2 to 3 months.

## 2023-07-20 NOTE — Assessment & Plan Note (Signed)
 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  We have ruled out everything else and has seen multiple providers including rheumatology even with a positive ANA does not show any signs of true rheumatological aspect.  Did discuss different medications and patient has agreed to try Cymbalta  20 mg and see how she responds to this.  In addition of this I do think custom orthotics would be beneficial for this individual at the moment.  Discussed icing regimen of home exercises.  Follow-up again in 6 to 8 weeks otherwise.

## 2023-07-21 DIAGNOSIS — L719 Rosacea, unspecified: Secondary | ICD-10-CM | POA: Diagnosis not present

## 2023-08-01 ENCOUNTER — Encounter: Payer: Self-pay | Admitting: Family Medicine

## 2023-08-01 ENCOUNTER — Ambulatory Visit: Admitting: Family Medicine

## 2023-08-01 VITALS — BP 118/82 | Ht 66.5 in | Wt 202.0 lb

## 2023-08-01 DIAGNOSIS — Q667 Congenital pes cavus, unspecified foot: Secondary | ICD-10-CM

## 2023-08-01 NOTE — Progress Notes (Cosign Needed)
 HPI: Patient is presenting for orthotics.  Patient recently saw Dr. Felipe Horton at [facility] sports medicine.  Patient has been dealing with some neuropathic pain in the bilateral feet and toes.  Patient has not had to wear hiking shoes due to the pain she gets wearing other shoes.  Patient states that Dr. Felipe Horton recommend she get orthotics.  Patient's goal is to be able to go for walks without having to wear her hiking boots/shoes.  Patient currently gets pain in her heel and in her toes whenever she goes on a long walk.  Physical exam: Inspection of the foot shows some splaying of the bilateral first toes on the right and left foot.  Patient has noted high arches.  There is some mild breakdown of the transverse arch.  No tenderness to palpation over the plantar fascia at its origin or throughout the midfoot.  No tenderness over the metatarsals.  Gait does show some mild inward pronation of the right foot.  No Trendelenburg noted.  Patient was fitted for a : Fit 'n Run semi-rigid orthotic  The orthotic was heated, placed on the orthotic stand.  The patient was positioned in subtalar neutral position and 10 degrees of ankle dorsiflexion in a weight bearing stance on the heated orthotic blank  After completion of molding  Blank: Fit 'n Run - size 8 Posting:  None  Base: none   A/P: Patient was fitted for standard orthotic today.  Patient was able to walk in her hiking shoes with the orthotic without any difficulty.  Did discuss with patient that the goal would be to we will place these in some running/walking shoes.  Patient will try and buy these over at [facility] feet.  Patient was otherwise advised to follow-up whenever she would like for adjustments if needed.

## 2023-08-02 NOTE — Addendum Note (Signed)
 Addended by: Marvel Slicker C on: 08/02/2023 11:04 AM   Modules accepted: Level of Service

## 2023-08-02 NOTE — Patient Instructions (Signed)
 Thank you for coming to see me today. It was a pleasure.   We made you new orthotics. Let us know if we need to add any extra cushioning or make changes.  If you have any questions or concerns, please do not hesitate to call the office at [phone].

## 2023-08-10 DIAGNOSIS — E039 Hypothyroidism, unspecified: Secondary | ICD-10-CM | POA: Diagnosis not present

## 2023-08-10 DIAGNOSIS — F419 Anxiety disorder, unspecified: Secondary | ICD-10-CM | POA: Diagnosis not present

## 2023-08-10 DIAGNOSIS — R7303 Prediabetes: Secondary | ICD-10-CM | POA: Diagnosis not present

## 2023-08-10 DIAGNOSIS — Z9884 Bariatric surgery status: Secondary | ICD-10-CM | POA: Diagnosis not present

## 2023-08-10 DIAGNOSIS — Z6831 Body mass index (BMI) 31.0-31.9, adult: Secondary | ICD-10-CM | POA: Diagnosis not present

## 2023-08-10 DIAGNOSIS — E66811 Obesity, class 1: Secondary | ICD-10-CM | POA: Diagnosis not present

## 2023-08-14 DIAGNOSIS — R051 Acute cough: Secondary | ICD-10-CM | POA: Diagnosis not present

## 2023-08-14 DIAGNOSIS — J019 Acute sinusitis, unspecified: Secondary | ICD-10-CM | POA: Diagnosis not present

## 2023-08-14 DIAGNOSIS — R0981 Nasal congestion: Secondary | ICD-10-CM | POA: Diagnosis not present

## 2023-08-31 DIAGNOSIS — Z9884 Bariatric surgery status: Secondary | ICD-10-CM | POA: Diagnosis not present

## 2023-08-31 DIAGNOSIS — E66811 Obesity, class 1: Secondary | ICD-10-CM | POA: Diagnosis not present

## 2023-08-31 DIAGNOSIS — Z6831 Body mass index (BMI) 31.0-31.9, adult: Secondary | ICD-10-CM | POA: Diagnosis not present

## 2023-08-31 DIAGNOSIS — E039 Hypothyroidism, unspecified: Secondary | ICD-10-CM | POA: Diagnosis not present

## 2023-08-31 DIAGNOSIS — R7303 Prediabetes: Secondary | ICD-10-CM | POA: Diagnosis not present

## 2023-09-25 DIAGNOSIS — Z6831 Body mass index (BMI) 31.0-31.9, adult: Secondary | ICD-10-CM | POA: Diagnosis not present

## 2023-09-25 DIAGNOSIS — E039 Hypothyroidism, unspecified: Secondary | ICD-10-CM | POA: Diagnosis not present

## 2023-09-25 DIAGNOSIS — R7303 Prediabetes: Secondary | ICD-10-CM | POA: Diagnosis not present

## 2023-09-25 DIAGNOSIS — Z9884 Bariatric surgery status: Secondary | ICD-10-CM | POA: Diagnosis not present

## 2023-09-25 DIAGNOSIS — E66811 Obesity, class 1: Secondary | ICD-10-CM | POA: Diagnosis not present

## 2023-09-25 NOTE — Progress Notes (Signed)
 [name] [address] [place] [address] Phone: [phone] Subjective:   LILLETTE Debra [name] am a scribe for Dr. [name].   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 We have ruled out everything else and has seen multiple providers including rheumatology even with a positive ANA does not show any signs of true rheumatological aspect. Did discuss different medications and patient has agreed to try Cymbalta  20 mg and see how she responds to this. In addition of this I do think custom orthotics would be beneficial for this individual at the moment. Discussed icing regimen of home exercises. Follow-up again in 6 to 8 weeks otherwise.   Update 10/05/2023 Debra [name] is a 68 y.o. female coming in with complaint of B foot pain. Custom orthotics 08/01/2023. Patient states they are about the same about 10% better.       Past Medical History:  Diagnosis Date   Depression (emotion)    Plantar fasciitis, bilateral    SUI (stress urinary incontinence, female)    Past Surgical History:  Procedure Laterality Date   LAPAROSCOPIC GASTRIC BYPASS  02/02/2005   Dr. Deedra Mechanic, [facility]   PANNICULECTOMY  2008   after gastric bypass   PLACEMENT OF BREAST IMPLANTS  2008   Social History   Socioeconomic History   Marital status: Single    Spouse name: Not on file   Number of children: Not on file   Years of education: Not on file   Highest education level: Not on file  Occupational History   Not on file  Tobacco Use   Smoking status: Never   Smokeless tobacco: Never  Substance and Sexual Activity   Alcohol use: No   Drug use: No   Sexual activity: Not on file  Other Topics Concern   Not on file  Social History Narrative   Not on file   Social Drivers of Health   Financial Resource Strain: Not on file  Food  Insecurity: Not on file  Transportation Needs: Not on file  Physical Activity: Not on file  Stress: Not on file  Social Connections: Not on file   No Known Allergies No family history on file.       Current Outpatient Medications (Other):    DULoxetine  (CYMBALTA ) 30 MG capsule, Take 1 capsule (30 mg total) by mouth daily.   Cholecalciferol (VITAMIN D ) 2000 units CAPS, Take by mouth.   Multiple Vitamin (MULTIVITAMIN) tablet, Take 1 tablet by mouth daily.   Omega-3 Fatty Acids (FISH OIL) 500 MG CAPS, Take by mouth.   thiamine  (VITAMIN B-1) 50 MG tablet, Take 50 mg by mouth daily.   Vitamin D , Ergocalciferol , (DRISDOL ) 50000 units CAPS capsule, Take 1 capsule (50,000 Units total) by mouth every 7 (seven) days.    Objective  Blood pressure 102/60, pulse 87, height 5' 6.5 (1.689 m), weight 200 lb 3.2 oz (90.8 kg), SpO2 98%.   General: No apparent distress alert and oriented x3 mood and affect normal, dressed appropriately.  HEENT: Pupils equal, extraocular movements intact  Respiratory: Patient's speak in full sentences and does not appear short of breath  Cardiovascular: No lower extremity edema, non tender, no erythema  Patient is wearing shoes today.  Deferred any significant type of treatment otherwise at the moment.    Impression and Recommendations:  The above documentation has been reviewed and is accurate and complete Alayna Mabe M Maressa Apollo, DO

## 2023-09-26 ENCOUNTER — Other Ambulatory Visit: Payer: Self-pay

## 2023-09-26 ENCOUNTER — Encounter: Payer: Self-pay | Admitting: Family Medicine

## 2023-09-26 MED ORDER — DULOXETINE HCL 30 MG PO CPEP: 30.0000 mg | ORAL_CAPSULE | Freq: Every day | ORAL | 0 refills | Status: DC

## 2023-10-05 ENCOUNTER — Encounter: Payer: Self-pay | Admitting: Family Medicine

## 2023-10-05 ENCOUNTER — Ambulatory Visit: Admitting: Family Medicine

## 2023-10-05 VITALS — BP 102/60 | HR 87 | Ht 66.5 in | Wt 200.2 lb

## 2023-10-05 DIAGNOSIS — M216X9 Other acquired deformities of unspecified foot: Secondary | ICD-10-CM

## 2023-10-05 NOTE — Patient Instructions (Signed)
 Good to see you. Keep wearing the good shoes. Continue taking the Cymbalta . See me again in 3 months.

## 2023-10-05 NOTE — Assessment & Plan Note (Signed)
 Patient will wear the custom orthotics on a more regular basis.  Has not been doing everything as regularly.  We have increased the Cymbalta  to 30 mg but has not been on it for a long amount of duration the patient would like to see how she does and will follow-up with us  through MyChart in 1 month.  Discussed with patient about icing regimen and home exercises otherwise.  Follow-up with me again 2 to 3 months otherwise.

## 2023-10-13 ENCOUNTER — Encounter: Payer: Self-pay | Admitting: Advanced Practice Midwife

## 2023-11-19 ENCOUNTER — Other Ambulatory Visit: Payer: Self-pay | Admitting: Family Medicine

## 2023-12-18 DIAGNOSIS — E66811 Obesity, class 1: Secondary | ICD-10-CM | POA: Diagnosis not present

## 2023-12-18 DIAGNOSIS — Z9884 Bariatric surgery status: Secondary | ICD-10-CM | POA: Diagnosis not present

## 2023-12-18 DIAGNOSIS — Z6832 Body mass index (BMI) 32.0-32.9, adult: Secondary | ICD-10-CM | POA: Diagnosis not present

## 2023-12-18 DIAGNOSIS — E039 Hypothyroidism, unspecified: Secondary | ICD-10-CM | POA: Diagnosis not present

## 2023-12-18 DIAGNOSIS — R7303 Prediabetes: Secondary | ICD-10-CM | POA: Diagnosis not present

## 2024-01-10 NOTE — Progress Notes (Unsigned)
 [name] [address] [place] [address] Phone: [phone] Subjective:   [name], am serving as a scribe for Dr. Arthea [name].  I'm seeing this patient by the request  of:  Teresa Channel, MD  CC: Bilateral foot pain  YEP:Dlagzrupcz  10/05/2023 Patient will wear the custom orthotics on a more regular basis.  Has not been doing everything as regularly.  We have increased the Cymbalta  to 30 mg but has not been on it for a long amount of duration the patient would like to see how she does and will follow-up with us  through MyChart in 1 month.  Discussed with patient about icing regimen and home exercises otherwise.  Follow-up with me again 2 to 3 months otherwise.      Update 01/11/2024 Debra [name] is a 68 y.o. female coming in with complaint of B foot pain. Has custom orthotics.  Also increase Cymbalta  to 30 mg.  Patient states a little progress maybe. Toes are still stiff. Occasionally will get burning sensation in toes. Still having pain on top of feet. Does feel a bit better during yoga.       Past Medical History:  Diagnosis Date   Depression (emotion)    Plantar fasciitis, bilateral    SUI (stress urinary incontinence, female)    Past Surgical History:  Procedure Laterality Date   LAPAROSCOPIC GASTRIC BYPASS  02/02/2005   Dr. Deedra Mechanic, [facility]   PANNICULECTOMY  2008   after gastric bypass   PLACEMENT OF BREAST IMPLANTS  2008   Social History   Socioeconomic History   Marital status: Single    Spouse name: Not on file   Number of children: Not on file   Years of education: Not on file   Highest education level: Not on file  Occupational History   Not on file  Tobacco Use   Smoking status: Never   Smokeless tobacco: Never  Substance and Sexual Activity   Alcohol use: No   Drug use: No   Sexual activity: Not on file  Other Topics Concern   Not on file  Social History Narrative   Not on file   Social  Drivers of Health   Financial Resource Strain: Not on file  Food Insecurity: Not on file  Transportation Needs: Not on file  Physical Activity: Not on file  Stress: Not on file  Social Connections: Not on file   No Known Allergies No family history on file.       Current Outpatient Medications (Other):    Cholecalciferol (VITAMIN D ) 2000 units CAPS, Take by mouth.   DULoxetine  (CYMBALTA ) 30 MG capsule, Take 1 capsule (30 mg total) by mouth daily.   Multiple Vitamin (MULTIVITAMIN) tablet, Take 1 tablet by mouth daily.   Omega-3 Fatty Acids (FISH OIL) 500 MG CAPS, Take by mouth.   thiamine  (VITAMIN B-1) 50 MG tablet, Take 50 mg by mouth daily.   Vitamin D , Ergocalciferol , (DRISDOL ) 50000 units CAPS capsule, Take 1 capsule (50,000 Units total) by mouth every 7 (seven) days.   Reviewed prior external information including notes and imaging from  primary care provider As well as notes that were available from care everywhere and other healthcare systems.  Past medical history, social, surgical and family history all reviewed in electronic medical record.  No pertanent information unless stated regarding to the chief complaint.   Review of Systems:  No headache, visual changes, nausea, vomiting, diarrhea, constipation, dizziness, abdominal pain,  skin rash, fevers, chills, night sweats, weight loss, swollen lymph nodes, body aches, joint swelling, chest pain, shortness of breath, mood changes. POSITIVE muscle aches  Objective  There were no vitals taken for this visit.   General: No apparent distress alert and oriented x3 mood and affect normal, dressed appropriately.  HEENT: Pupils equal, extraocular movements intact  Respiratory: Patient's speak in full sentences and does not appear short of breath  Cardiovascular: No lower extremity edema, non tender, no erythema  Foot exam shows still the same date breakdown that noticed before, and does feel better with the capsulitis but is  doing well.  Able to go from sitting to standing and start ambulation without any type of limp.    Impression and Recommendations:    The above documentation has been reviewed and is accurate and complete Annalea Alguire M Neva Ramaswamy, DO

## 2024-01-11 ENCOUNTER — Ambulatory Visit: Admitting: Family Medicine

## 2024-01-11 VITALS — BP 102/70 | HR 91 | Ht 66.0 in | Wt 194.0 lb

## 2024-01-11 DIAGNOSIS — M7752 Other enthesopathy of left foot: Secondary | ICD-10-CM | POA: Diagnosis not present

## 2024-01-11 DIAGNOSIS — M216X9 Other acquired deformities of unspecified foot: Secondary | ICD-10-CM | POA: Diagnosis not present

## 2024-01-11 DIAGNOSIS — L719 Rosacea, unspecified: Secondary | ICD-10-CM | POA: Diagnosis not present

## 2024-01-11 MED ORDER — PREDNISONE 20 MG PO TABS: 20.0000 mg | ORAL_TABLET | Freq: Every day | ORAL | 0 refills | Status: AC

## 2024-01-11 NOTE — Assessment & Plan Note (Signed)
 I feel patient is in a good place at the moment.  Laboratory workup was fairly unremarkable.  Will continue to be active.  Will follow-up with me as needed

## 2024-01-11 NOTE — Assessment & Plan Note (Signed)
 Discussed some other shoes that I think will be helpful.  Can repeat injections if necessary.  Continue to be active.  Follow-up again in 6 to 8 weeks

## 2024-01-11 NOTE — Patient Instructions (Addendum)
 Gravity defyer shoes Prednisone  20mg  for trip Don't lace middle eye on shoes Vit D 2000-4000iu  Zinc 30mg  Magnesium 400mg  Tart cherry 2400mg  See you again in 3 months

## 2024-01-20 ENCOUNTER — Other Ambulatory Visit: Payer: Self-pay | Admitting: Family Medicine

## 2024-01-24 DIAGNOSIS — Z1231 Encounter for screening mammogram for malignant neoplasm of breast: Secondary | ICD-10-CM | POA: Diagnosis not present

## 2024-01-29 DIAGNOSIS — E039 Hypothyroidism, unspecified: Secondary | ICD-10-CM | POA: Diagnosis not present

## 2024-01-29 DIAGNOSIS — Z8639 Personal history of other endocrine, nutritional and metabolic disease: Secondary | ICD-10-CM | POA: Diagnosis not present

## 2024-01-29 DIAGNOSIS — Z6829 Body mass index (BMI) 29.0-29.9, adult: Secondary | ICD-10-CM | POA: Diagnosis not present

## 2024-01-29 DIAGNOSIS — R7303 Prediabetes: Secondary | ICD-10-CM | POA: Diagnosis not present

## 2024-01-29 DIAGNOSIS — Z9884 Bariatric surgery status: Secondary | ICD-10-CM | POA: Diagnosis not present

## 2024-01-29 DIAGNOSIS — E663 Overweight: Secondary | ICD-10-CM | POA: Diagnosis not present

## 2024-04-11 NOTE — Progress Notes (Signed)
 " [name] [address] [place] [address] Phone: [phone] Subjective:   Debra [name] am a scribe for Dr. [name].   I'm seeing this patient by the request  of:  Debra Channel, MD  CC: left foot pain   YEP:Dlagzrupcz  01/11/2024 Discussed some other shoes that I think will be helpful.  Can repeat injections if necessary.  Continue to be active.  Follow-up again in 6 to 8 weeks     I feel patient is in a good place at the moment.  Laboratory workup was fairly unremarkable.  Will continue to be active.  Will follow-up with me as needed     Update 04/16/2024 Debra [name] is a 69 y.o. female coming in with complaint of L foot pain. Patient states that it hurts daily. Discomfort daily and pain infrequently. The left foot today it has been fine this morning.       Past Medical History:  Diagnosis Date   Depression (emotion)    Plantar fasciitis, bilateral    SUI (stress urinary incontinence, female)    Past Surgical History:  Procedure Laterality Date   LAPAROSCOPIC GASTRIC BYPASS  02/02/2005   Dr. Deedra [name], [facility]   PANNICULECTOMY  2008   after gastric bypass   PLACEMENT OF BREAST IMPLANTS  2008   Social History   Socioeconomic History   Marital status: Single    Spouse name: Not on file   Number of children: Not on file   Years of education: Not on file   Highest education level: Not on file  Occupational History   Not on file  Tobacco Use   Smoking status: Never   Smokeless tobacco: Never  Substance and Sexual Activity   Alcohol use: No   Drug use: No   Sexual activity: Not on file  Other Topics Concern   Not on file  Social History Narrative   Not on file   Social Drivers of Health   Tobacco Use: Low Risk (10/05/2023)   Patient History    Smoking Tobacco Use: Never    Smokeless Tobacco Use: Never    Passive Exposure: Not on file  Financial Resource Strain: Not on file  Food Insecurity: Not on file   Transportation Needs: Not on file  Physical Activity: Not on file  Stress: Not on file  Social Connections: Not on file  Depression (EYV7-0): Not on file  Alcohol Screen: Not on file  Housing: Not on file  Utilities: Not on file  Health Literacy: Not on file   Allergies[1] No family history on file.  Current Outpatient Medications (Endocrine & Metabolic):    predniSONE  (DELTASONE ) 20 MG tablet, Take 1 tablet (20 mg total) by mouth daily with breakfast.  Current Outpatient Medications (Other):    Cholecalciferol (VITAMIN D ) 2000 units CAPS, Take by mouth.   DULoxetine  (CYMBALTA ) 30 MG capsule, Take 1 capsule (30 mg total) by mouth daily.   Multiple Vitamin (MULTIVITAMIN) tablet, Take 1 tablet by mouth daily.   Omega-3 Fatty Acids (FISH OIL) 500 MG CAPS, Take by mouth.   thiamine  (VITAMIN B-1) 50 MG tablet, Take 50 mg by mouth daily.   Vitamin D , Ergocalciferol , (DRISDOL ) 50000 units CAPS capsule, Take 1 capsule (50,000 Units total) by mouth every 7 (seven) days.   Reviewed prior external information including notes and imaging from  primary care provider As well as notes that were available from care everywhere and other healthcare systems.  Past medical  history, social, surgical and family history all reviewed in electronic medical record.  No pertanent information unless stated regarding to the chief complaint.   Review of Systems:  No headache, visual changes, nausea, vomiting, diarrhea, constipation, dizziness, abdominal pain, skin rash, fevers, chills, night sweats, weight loss, swollen lymph nodes, body aches, joint swelling, chest pain, shortness of breath, mood changes. POSITIVE muscle aches  Objective  Blood pressure 122/70, pulse 68, height 5' 6 (1.676 m), weight 175 lb 9.6 oz (79.7 kg), SpO2 98%.   General: No apparent distress alert and oriented x3 mood and affect normal, dressed appropriately.  HEENT: Pupils equal, extraocular movements intact  Respiratory:  Patient's speak in full sentences and does not appear short of breath  Cardiovascular: No lower extremity edema, non tender, no erythema  Left foot exam shows significant rigid foot noted bilaterally of the midfoot.  Patient is tender to palpation over the midfoot.  Does have some spurring noted. Both feet though does have rigid midfoot noted.  Limited muscular skeletal ultrasound was performed and interpreted by Debra [name], M  Limited ultrasound in patient's midfoot does show some cortical irregularities noted that is significant for more of a chronic inflammation though with hypoechoic changes as well.  No true acute fracture noted.   Impression and Recommendations:     The above documentation has been reviewed and is accurate and complete Debra [name] M Debra Legrand, DO        [1] No Known Allergies  "

## 2024-04-16 ENCOUNTER — Other Ambulatory Visit: Payer: Self-pay

## 2024-04-16 ENCOUNTER — Ambulatory Visit: Admitting: Family Medicine

## 2024-04-16 ENCOUNTER — Encounter: Payer: Self-pay | Admitting: Family Medicine

## 2024-04-16 VITALS — BP 122/70 | HR 68 | Ht 66.0 in | Wt 175.6 lb

## 2024-04-16 DIAGNOSIS — M79672 Pain in left foot: Secondary | ICD-10-CM

## 2024-04-16 DIAGNOSIS — M216X9 Other acquired deformities of unspecified foot: Secondary | ICD-10-CM | POA: Diagnosis not present

## 2024-04-16 NOTE — Assessment & Plan Note (Signed)
 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  Patient would like to avoid injections if possible but would like to consider the possibility of shockwave therapy.  I think we can split 1 treatment between both feet.  Discussed with patient about icing regimen and home exercises otherwise.  Discussed which activities to do and which ones to avoid.  Discussed proper shoes again.  Patient has had gastric bypass previously so using the lower dose of the meloxicam  but encouraged her to use that in 5-day burst and continue the Cymbalta .

## 2024-04-16 NOTE — Patient Instructions (Addendum)
 Good to see you. Continue the Cymbalta .  Meloxicam  use in 5 day burst when needed. Set up shockwave for Bilateral feet that we can split in 1 treatment. See me again in 6 to 8 weeks.

## 2024-04-18 ENCOUNTER — Ambulatory Visit: Payer: Self-pay | Admitting: Sports Medicine

## 2024-04-18 DIAGNOSIS — M79672 Pain in left foot: Secondary | ICD-10-CM

## 2024-04-18 DIAGNOSIS — M79671 Pain in right foot: Secondary | ICD-10-CM

## 2024-04-18 NOTE — Progress Notes (Signed)
"             ° °  Morene Mace [facility] [place] [facility] [address] [place] [address] Phone: [phone]   Extracorporeal Shockwave Therapy Note    Patient is being treated today with ECSWT. Informed consent was obtained and patient tolerated procedure well.   Therapy performed by Morene Mace  Condition treated: Bilateral foot pain, midfoot osteoarthritis Treatment preset used: Bone spur acute Energy used: 120 mJ used for first 1000 pulses split between bilateral feet.  Energy was decreased to 90 mJ for final 1000 pulses due to pain. Frequency used: 10 hz Number of pulses: 2000 total.  Split with an even 1000 per foot Head Size: Medium Treatment #1 of #3  Electronically signed by:  Morene Mace [facility] 1:51 PM 04/18/24 "

## 2024-04-22 ENCOUNTER — Encounter: Payer: Self-pay | Admitting: Sports Medicine

## 2024-04-25 ENCOUNTER — Ambulatory Visit: Payer: Self-pay | Admitting: Sports Medicine

## 2024-04-25 DIAGNOSIS — M79672 Pain in left foot: Secondary | ICD-10-CM

## 2024-04-25 DIAGNOSIS — M79671 Pain in right foot: Secondary | ICD-10-CM

## 2024-04-25 NOTE — Progress Notes (Signed)
"             ° °  [name] [address] [place] [address] Phone: [phone]   Extracorporeal Shockwave Therapy Note    Patient is being treated today with ECSWT. Informed consent was obtained and patient tolerated procedure well, but experienced moderate pain throughout procedure  Patient asked how we would know if her bone spurs were broken up after shockwave therapy.  I informed her that shockwave therapy does not break up bone spurs.  Patient had no improvement between 1st and 2nd treatment.  I informed her that if she does not experience any improvement before third treatment, she can cancel appointment and make a follow-up appointment with Dr. [name] to review next steps in treatment plan.  Therapy performed by Debra [name]  Condition treated: Bilateral foot pain, midfoot osteoarthritis  Treatment preset used: Bone spur acute  Energy used: 90 mJ to start and decrease to 70 halfway due to pain Frequency used: 10 Hz to start to decrease to 8 halfway due to pain Number of pulses: 2000 total, Split 1000 per foot Head Size: medium Treatment #2 of #3  Electronically signed by:  [name] 1:45 PM 04/25/24 "

## 2024-05-02 ENCOUNTER — Ambulatory Visit: Admitting: Sports Medicine

## 2024-06-20 ENCOUNTER — Ambulatory Visit: Admitting: Family Medicine
